# Patient Record
Sex: Female | Born: 1940 | Race: White | Hispanic: No | Smoking: Never smoker
Health system: Southern US, Community
[De-identification: ages and names within clinical notes are randomized; demographics above are authoritative.]

## PROBLEM LIST (undated history)

## (undated) DIAGNOSIS — C801 Malignant (primary) neoplasm, unspecified: Secondary | ICD-10-CM

## (undated) DIAGNOSIS — K589 Irritable bowel syndrome without diarrhea: Secondary | ICD-10-CM

## (undated) DIAGNOSIS — Z9889 Other specified postprocedural states: Secondary | ICD-10-CM

## (undated) DIAGNOSIS — M1990 Unspecified osteoarthritis, unspecified site: Secondary | ICD-10-CM

## (undated) DIAGNOSIS — R42 Dizziness and giddiness: Secondary | ICD-10-CM

## (undated) DIAGNOSIS — R112 Nausea with vomiting, unspecified: Secondary | ICD-10-CM

## (undated) DIAGNOSIS — Z923 Personal history of irradiation: Secondary | ICD-10-CM

## (undated) DIAGNOSIS — N6019 Diffuse cystic mastopathy of unspecified breast: Secondary | ICD-10-CM

## (undated) DIAGNOSIS — I10 Essential (primary) hypertension: Secondary | ICD-10-CM

## (undated) DIAGNOSIS — Z8719 Personal history of other diseases of the digestive system: Secondary | ICD-10-CM

## (undated) DIAGNOSIS — E785 Hyperlipidemia, unspecified: Secondary | ICD-10-CM

## (undated) DIAGNOSIS — I499 Cardiac arrhythmia, unspecified: Secondary | ICD-10-CM

## (undated) DIAGNOSIS — C50919 Malignant neoplasm of unspecified site of unspecified female breast: Secondary | ICD-10-CM

## (undated) DIAGNOSIS — K219 Gastro-esophageal reflux disease without esophagitis: Secondary | ICD-10-CM

## (undated) DIAGNOSIS — R001 Bradycardia, unspecified: Secondary | ICD-10-CM

## (undated) DIAGNOSIS — Z8489 Family history of other specified conditions: Secondary | ICD-10-CM

## (undated) DIAGNOSIS — R072 Precordial pain: Secondary | ICD-10-CM

## (undated) HISTORY — PX: EYE SURGERY: SHX253

## (undated) HISTORY — PX: BREAST LUMPECTOMY: SHX2

## (undated) HISTORY — PX: ABDOMINAL HYSTERECTOMY: SHX81

---

## 2001-03-12 DIAGNOSIS — C50911 Malignant neoplasm of unspecified site of right female breast: Secondary | ICD-10-CM

## 2001-03-12 HISTORY — DX: Malignant neoplasm of unspecified site of right female breast: C50.911

## 2001-03-12 HISTORY — PX: BREAST BIOPSY: SHX20

## 2001-12-31 ENCOUNTER — Encounter (HOSPITAL_COMMUNITY): Admission: RE | Admit: 2001-12-31 | Discharge: 2002-03-31 | Payer: Self-pay | Admitting: Internal Medicine

## 2001-12-31 ENCOUNTER — Encounter: Payer: Self-pay | Admitting: Internal Medicine

## 2002-01-01 ENCOUNTER — Encounter: Payer: Self-pay | Admitting: Internal Medicine

## 2002-01-09 ENCOUNTER — Encounter: Payer: Self-pay | Admitting: Internal Medicine

## 2002-01-09 ENCOUNTER — Encounter: Admission: RE | Admit: 2002-01-09 | Discharge: 2002-01-09 | Payer: Self-pay | Admitting: Internal Medicine

## 2002-01-09 ENCOUNTER — Encounter (INDEPENDENT_AMBULATORY_CARE_PROVIDER_SITE_OTHER): Payer: Self-pay | Admitting: Specialist

## 2002-01-19 ENCOUNTER — Encounter: Payer: Self-pay | Admitting: General Surgery

## 2002-01-19 ENCOUNTER — Encounter: Admission: RE | Admit: 2002-01-19 | Discharge: 2002-01-19 | Payer: Self-pay | Admitting: General Surgery

## 2002-01-20 ENCOUNTER — Encounter: Payer: Self-pay | Admitting: General Surgery

## 2002-01-20 ENCOUNTER — Ambulatory Visit (HOSPITAL_BASED_OUTPATIENT_CLINIC_OR_DEPARTMENT_OTHER): Admission: RE | Admit: 2002-01-20 | Discharge: 2002-01-20 | Payer: Self-pay | Admitting: General Surgery

## 2002-01-20 ENCOUNTER — Encounter (INDEPENDENT_AMBULATORY_CARE_PROVIDER_SITE_OTHER): Payer: Self-pay | Admitting: Specialist

## 2002-01-20 ENCOUNTER — Encounter: Admission: RE | Admit: 2002-01-20 | Discharge: 2002-01-20 | Payer: Self-pay | Admitting: General Surgery

## 2002-01-29 ENCOUNTER — Ambulatory Visit: Admission: RE | Admit: 2002-01-29 | Discharge: 2002-02-16 | Payer: Self-pay | Admitting: Radiation Oncology

## 2002-08-24 ENCOUNTER — Encounter: Admission: RE | Admit: 2002-08-24 | Discharge: 2002-08-24 | Payer: Self-pay | Admitting: General Surgery

## 2002-08-24 ENCOUNTER — Encounter: Payer: Self-pay | Admitting: General Surgery

## 2003-01-28 ENCOUNTER — Encounter: Admission: RE | Admit: 2003-01-28 | Discharge: 2003-01-28 | Payer: Self-pay | Admitting: General Surgery

## 2003-09-17 ENCOUNTER — Encounter: Admission: RE | Admit: 2003-09-17 | Discharge: 2003-09-17 | Payer: Self-pay | Admitting: Oncology

## 2004-01-26 ENCOUNTER — Ambulatory Visit: Payer: Self-pay | Admitting: Oncology

## 2004-02-01 ENCOUNTER — Encounter: Admission: RE | Admit: 2004-02-01 | Discharge: 2004-02-01 | Payer: Self-pay | Admitting: Oncology

## 2004-07-24 ENCOUNTER — Ambulatory Visit: Payer: Self-pay | Admitting: Oncology

## 2004-08-01 ENCOUNTER — Encounter: Admission: RE | Admit: 2004-08-01 | Discharge: 2004-08-01 | Payer: Self-pay | Admitting: General Surgery

## 2004-12-07 ENCOUNTER — Ambulatory Visit: Payer: Self-pay | Admitting: Radiation Oncology

## 2005-01-26 ENCOUNTER — Ambulatory Visit: Payer: Self-pay | Admitting: Oncology

## 2005-02-07 ENCOUNTER — Encounter: Admission: RE | Admit: 2005-02-07 | Discharge: 2005-02-07 | Payer: Self-pay | Admitting: General Surgery

## 2005-02-22 ENCOUNTER — Encounter: Admission: RE | Admit: 2005-02-22 | Discharge: 2005-02-22 | Payer: Self-pay | Admitting: General Surgery

## 2005-07-30 ENCOUNTER — Ambulatory Visit: Payer: Self-pay | Admitting: Oncology

## 2005-08-01 ENCOUNTER — Encounter: Admission: RE | Admit: 2005-08-01 | Discharge: 2005-08-01 | Payer: Self-pay | Admitting: Oncology

## 2005-08-01 LAB — CBC WITH DIFFERENTIAL/PLATELET
BASO%: 0.3 % (ref 0.0–2.0)
Basophils Absolute: 0 10*3/uL (ref 0.0–0.1)
EOS%: 6.5 % (ref 0.0–7.0)
Eosinophils Absolute: 0.2 10*3/uL (ref 0.0–0.5)
HCT: 38.5 % (ref 34.8–46.6)
HGB: 13.4 g/dL (ref 11.6–15.9)
LYMPH%: 25.4 % (ref 14.0–48.0)
MCH: 31.6 pg (ref 26.0–34.0)
MCHC: 34.8 g/dL (ref 32.0–36.0)
MCV: 91 fL (ref 81.0–101.0)
MONO#: 0.5 10*3/uL (ref 0.1–0.9)
MONO%: 13.9 % — ABNORMAL HIGH (ref 0.0–13.0)
NEUT#: 1.9 10*3/uL (ref 1.5–6.5)
NEUT%: 53.9 % (ref 39.6–76.8)
Platelets: 277 10*3/uL (ref 145–400)
RBC: 4.23 10*6/uL (ref 3.70–5.32)
RDW: 13 % (ref 11.3–14.5)
WBC: 3.6 10*3/uL — ABNORMAL LOW (ref 3.9–10.0)
lymph#: 0.9 10*3/uL (ref 0.9–3.3)

## 2005-08-01 LAB — COMPREHENSIVE METABOLIC PANEL
ALT: 30 U/L (ref 0–40)
AST: 27 U/L (ref 0–37)
Albumin: 4.5 g/dL (ref 3.5–5.2)
Alkaline Phosphatase: 63 U/L (ref 39–117)
BUN: 11 mg/dL (ref 6–23)
CO2: 27 mEq/L (ref 19–32)
Calcium: 9.2 mg/dL (ref 8.4–10.5)
Chloride: 103 mEq/L (ref 96–112)
Creatinine, Ser: 1 mg/dL (ref 0.4–1.2)
Glucose, Bld: 110 mg/dL — ABNORMAL HIGH (ref 70–99)
Potassium: 3.5 mEq/L (ref 3.5–5.3)
Sodium: 139 mEq/L (ref 135–145)
Total Bilirubin: 0.6 mg/dL (ref 0.3–1.2)
Total Protein: 6.8 g/dL (ref 6.0–8.3)

## 2005-12-10 ENCOUNTER — Ambulatory Visit: Payer: Self-pay | Admitting: Radiation Oncology

## 2006-02-08 ENCOUNTER — Encounter: Admission: RE | Admit: 2006-02-08 | Discharge: 2006-02-08 | Payer: Self-pay | Admitting: General Surgery

## 2006-07-29 ENCOUNTER — Ambulatory Visit: Payer: Self-pay | Admitting: Oncology

## 2006-07-31 LAB — CBC WITH DIFFERENTIAL/PLATELET
Basophils Absolute: 0 10*3/uL (ref 0.0–0.1)
EOS%: 4.8 % (ref 0.0–7.0)
HCT: 40 % (ref 34.8–46.6)
HGB: 14.4 g/dL (ref 11.6–15.9)
LYMPH%: 32.1 % (ref 14.0–48.0)
MCH: 32 pg (ref 26.0–34.0)
MCV: 89.3 fL (ref 81.0–101.0)
MONO%: 11.7 % (ref 0.0–13.0)
NEUT%: 51 % (ref 39.6–76.8)
Platelets: 269 10*3/uL (ref 145–400)
lymph#: 1.6 10*3/uL (ref 0.9–3.3)

## 2006-07-31 LAB — COMPREHENSIVE METABOLIC PANEL
AST: 22 U/L (ref 0–37)
BUN: 17 mg/dL (ref 6–23)
Calcium: 10 mg/dL (ref 8.4–10.5)
Chloride: 101 mEq/L (ref 96–112)
Creatinine, Ser: 1.06 mg/dL (ref 0.40–1.20)
Total Bilirubin: 0.8 mg/dL (ref 0.3–1.2)

## 2006-10-22 ENCOUNTER — Ambulatory Visit: Payer: Self-pay | Admitting: Gastroenterology

## 2007-01-24 ENCOUNTER — Ambulatory Visit: Payer: Self-pay | Admitting: Oncology

## 2007-01-29 LAB — COMPREHENSIVE METABOLIC PANEL
AST: 23 U/L (ref 0–37)
Albumin: 4.9 g/dL (ref 3.5–5.2)
BUN: 20 mg/dL (ref 6–23)
Calcium: 9.8 mg/dL (ref 8.4–10.5)
Chloride: 103 mEq/L (ref 96–112)
Creatinine, Ser: 1.05 mg/dL (ref 0.40–1.20)
Glucose, Bld: 93 mg/dL (ref 70–99)

## 2007-01-29 LAB — CBC WITH DIFFERENTIAL/PLATELET
Basophils Absolute: 0 10*3/uL (ref 0.0–0.1)
EOS%: 5.1 % (ref 0.0–7.0)
Eosinophils Absolute: 0.3 10*3/uL (ref 0.0–0.5)
HCT: 42.3 % (ref 34.8–46.6)
HGB: 15 g/dL (ref 11.6–15.9)
MCH: 31.9 pg (ref 26.0–34.0)
MCV: 89.8 fL (ref 81.0–101.0)
MONO%: 9.4 % (ref 0.0–13.0)
NEUT#: 3.1 10*3/uL (ref 1.5–6.5)
NEUT%: 56.8 % (ref 39.6–76.8)
lymph#: 1.5 10*3/uL (ref 0.9–3.3)

## 2007-01-29 LAB — LACTATE DEHYDROGENASE: LDH: 167 U/L (ref 94–250)

## 2007-02-10 ENCOUNTER — Encounter: Admission: RE | Admit: 2007-02-10 | Discharge: 2007-02-10 | Payer: Self-pay | Admitting: Oncology

## 2007-02-14 ENCOUNTER — Encounter: Admission: RE | Admit: 2007-02-14 | Discharge: 2007-02-14 | Payer: Self-pay | Admitting: Oncology

## 2007-06-07 ENCOUNTER — Ambulatory Visit: Payer: Self-pay

## 2007-07-31 ENCOUNTER — Ambulatory Visit: Payer: Self-pay | Admitting: Oncology

## 2007-08-05 ENCOUNTER — Encounter: Admission: RE | Admit: 2007-08-05 | Discharge: 2007-08-05 | Payer: Self-pay | Admitting: Oncology

## 2008-02-13 ENCOUNTER — Encounter: Admission: RE | Admit: 2008-02-13 | Discharge: 2008-02-13 | Payer: Self-pay | Admitting: Oncology

## 2008-07-28 ENCOUNTER — Ambulatory Visit: Payer: Self-pay | Admitting: Oncology

## 2008-07-30 LAB — CBC WITH DIFFERENTIAL/PLATELET
BASO%: 0.5 % (ref 0.0–2.0)
Basophils Absolute: 0 10*3/uL (ref 0.0–0.1)
EOS%: 6.6 % (ref 0.0–7.0)
Eosinophils Absolute: 0.3 10*3/uL (ref 0.0–0.5)
HCT: 37.6 % (ref 34.8–46.6)
HGB: 13.2 g/dL (ref 11.6–15.9)
LYMPH%: 26.5 % (ref 14.0–49.7)
MCH: 32.1 pg (ref 25.1–34.0)
MCHC: 35.1 g/dL (ref 31.5–36.0)
MCV: 91.5 fL (ref 79.5–101.0)
MONO#: 0.4 10*3/uL (ref 0.1–0.9)
MONO%: 7.9 % (ref 0.0–14.0)
NEUT#: 2.6 10*3/uL (ref 1.5–6.5)
NEUT%: 58.5 % (ref 38.4–76.8)
Platelets: 217 10*3/uL (ref 145–400)
RBC: 4.11 10*6/uL (ref 3.70–5.45)
RDW: 13.4 % (ref 11.2–14.5)
WBC: 4.5 10*3/uL (ref 3.9–10.3)
lymph#: 1.2 10*3/uL (ref 0.9–3.3)

## 2008-08-02 LAB — COMPREHENSIVE METABOLIC PANEL
ALT: 20 U/L (ref 0–35)
AST: 21 U/L (ref 0–37)
Albumin: 4.4 g/dL (ref 3.5–5.2)
Alkaline Phosphatase: 56 U/L (ref 39–117)
BUN: 17 mg/dL (ref 6–23)
CO2: 28 mEq/L (ref 19–32)
Calcium: 9.9 mg/dL (ref 8.4–10.5)
Chloride: 103 mEq/L (ref 96–112)
Creatinine, Ser: 0.86 mg/dL (ref 0.40–1.20)
Glucose, Bld: 114 mg/dL — ABNORMAL HIGH (ref 70–99)
Potassium: 3.6 mEq/L (ref 3.5–5.3)
Sodium: 142 mEq/L (ref 135–145)
Total Bilirubin: 0.7 mg/dL (ref 0.3–1.2)
Total Protein: 6.8 g/dL (ref 6.0–8.3)

## 2008-08-02 LAB — CANCER ANTIGEN 27.29: CA 27.29: 11 U/mL (ref 0–39)

## 2008-08-02 LAB — VITAMIN D 25 HYDROXY (VIT D DEFICIENCY, FRACTURES): Vit D, 25-Hydroxy: 31 ng/mL (ref 30–89)

## 2008-08-02 LAB — LACTATE DEHYDROGENASE: LDH: 157 U/L (ref 94–250)

## 2008-08-26 ENCOUNTER — Observation Stay: Payer: Self-pay | Admitting: Internal Medicine

## 2008-09-28 ENCOUNTER — Ambulatory Visit: Payer: Self-pay | Admitting: Oncology

## 2009-02-14 ENCOUNTER — Encounter: Admission: RE | Admit: 2009-02-14 | Discharge: 2009-02-14 | Payer: Self-pay | Admitting: Oncology

## 2009-03-09 ENCOUNTER — Ambulatory Visit: Payer: Self-pay | Admitting: Internal Medicine

## 2009-03-23 ENCOUNTER — Ambulatory Visit: Payer: Self-pay | Admitting: Oncology

## 2009-03-25 LAB — COMPREHENSIVE METABOLIC PANEL
ALT: 15 U/L (ref 0–35)
AST: 17 U/L (ref 0–37)
Albumin: 4.4 g/dL (ref 3.5–5.2)
Alkaline Phosphatase: 48 U/L (ref 39–117)
BUN: 18 mg/dL (ref 6–23)
CO2: 26 mEq/L (ref 19–32)
Calcium: 9.3 mg/dL (ref 8.4–10.5)
Chloride: 102 mEq/L (ref 96–112)
Creatinine, Ser: 1.18 mg/dL (ref 0.40–1.20)
Glucose, Bld: 106 mg/dL — ABNORMAL HIGH (ref 70–99)
Potassium: 3.9 mEq/L (ref 3.5–5.3)
Sodium: 143 mEq/L (ref 135–145)
Total Bilirubin: 0.7 mg/dL (ref 0.3–1.2)
Total Protein: 6.6 g/dL (ref 6.0–8.3)

## 2009-03-25 LAB — CBC WITH DIFFERENTIAL/PLATELET
BASO%: 0.5 % (ref 0.0–2.0)
Basophils Absolute: 0 10*3/uL (ref 0.0–0.1)
EOS%: 4.3 % (ref 0.0–7.0)
Eosinophils Absolute: 0.3 10*3/uL (ref 0.0–0.5)
HCT: 41.1 % (ref 34.8–46.6)
HGB: 14.1 g/dL (ref 11.6–15.9)
LYMPH%: 25 % (ref 14.0–49.7)
MCH: 31.5 pg (ref 25.1–34.0)
MCHC: 34.3 g/dL (ref 31.5–36.0)
MCV: 91.9 fL (ref 79.5–101.0)
MONO#: 0.5 10*3/uL (ref 0.1–0.9)
MONO%: 9.3 % (ref 0.0–14.0)
NEUT#: 3.5 10*3/uL (ref 1.5–6.5)
NEUT%: 60.9 % (ref 38.4–76.8)
Platelets: 194 10*3/uL (ref 145–400)
RBC: 4.47 10*6/uL (ref 3.70–5.45)
RDW: 13.1 % (ref 11.2–14.5)
WBC: 5.8 10*3/uL (ref 3.9–10.3)
lymph#: 1.5 10*3/uL (ref 0.9–3.3)

## 2009-03-25 LAB — CANCER ANTIGEN 27.29: CA 27.29: 12 U/mL (ref 0–39)

## 2009-03-25 LAB — VITAMIN D 25 HYDROXY (VIT D DEFICIENCY, FRACTURES): Vit D, 25-Hydroxy: 39 ng/mL (ref 30–89)

## 2009-03-25 LAB — LACTATE DEHYDROGENASE: LDH: 175 U/L (ref 94–250)

## 2009-12-23 ENCOUNTER — Ambulatory Visit: Payer: Self-pay | Admitting: Gastroenterology

## 2010-01-17 ENCOUNTER — Ambulatory Visit: Payer: Self-pay | Admitting: Internal Medicine

## 2010-02-15 ENCOUNTER — Encounter
Admission: RE | Admit: 2010-02-15 | Discharge: 2010-02-15 | Payer: Self-pay | Source: Home / Self Care | Admitting: Oncology

## 2010-04-01 ENCOUNTER — Encounter: Payer: Self-pay | Admitting: Oncology

## 2010-07-28 NOTE — Op Note (Signed)
NAME:  Cheryl Morris, Cheryl Morris                         ACCOUNT NO.:  [id number]   MEDICAL RECORD NO.:  [id number]                   PATIENT TYPE:  AMB   LOCATION:  DSC                                  FACILITY:  [facility]   PHYSICIAN:  Rose Phi. Maple Hudson, M.D.                DATE OF BIRTH:  13-Mar-1940   DATE OF PROCEDURE:  01/20/2002  DATE OF DISCHARGE:                                 OPERATIVE REPORT   PREOPERATIVE DIAGNOSIS:  Stage I carcinoma of the right breast.   POSTOPERATIVE DIAGNOSIS:  Stage I carcinoma of the right breast.   OPERATION PERFORMED:  1. Blue dye injection.  2. Right partial mastectomy with needle localization and specimen     mammography.  3. Right axillary sentinel lymph node biopsy.   SURGEON:  Rose Phi. Maple Hudson, M.D.   ANESTHESIA:  General.   INDICATIONS FOR PROCEDURE:  This patient had presented with an abnormal  mammogram which, on core biopsy, was shown to have a small invasive cancer  at about the 11:30 to 12 o'clock position of the right breast.  Prior to  coming to the operating room, an ultrasound guided localization wire was  placed in position and 1 mCi of technetium sulfur colloid was injected  intradermally.   DESCRIPTION OF PROCEDURE:  After suitable general anesthesia was induced,  the patient was placed in supine position and then we injected 5 cc of  Lymphazurin blue in the subareolar tissue.  We then prepped and draped the  breast and axilla.  A curved incision in the 12 o'clock position of the  right breast was then made using a previously placed wire and a marking at  the 12 o'clock position and a wide excision of the wire and surrounding  tissue was carried out.  Specimen was oriented for the pathologist.  Specimen mammography confirmed the removal of the lesion and Touch Preps of  the margins showed no tumor involvement.   While that was being done we scanned the right axilla and then made a short  transverse right axillary incision with  dissection through the subcutaneous  tissues to the clavipectoral fascia.  Just beneath the clavipectoral fascia  was a blue and hot lymph node with counts in excess of 500.  We mobilized  that, clipped the lymphatics and removed the one hot and blue sentinel node.  There was no other palpable blue or hot nodes.  Touch Preps of the lymph  node showed no metastatic disease.   Incisions were closed with 3-0 Vicryl and subcuticular 4-0 Monocryl and  Steri-Strips.  Dressings applied.  The patient was then transferred to the  recovery room in satisfactory condition having tolerated the procedure well.  Rose Phi. Maple Hudson, M.D.    PRY/MEDQ  D:  01/20/2002  T:  01/20/2002  Job:  [id number]   cc:   Yates Decamp

## 2010-12-12 ENCOUNTER — Other Ambulatory Visit: Payer: Self-pay | Admitting: Internal Medicine

## 2010-12-12 DIAGNOSIS — Z1231 Encounter for screening mammogram for malignant neoplasm of breast: Secondary | ICD-10-CM

## 2011-02-19 ENCOUNTER — Ambulatory Visit
Admission: RE | Admit: 2011-02-19 | Discharge: 2011-02-19 | Disposition: A | Discharge status: Home / Self Care | Payer: Medicare Other | Source: Ambulatory Visit | Attending: Unknown Physician Specialty | Admitting: Unknown Physician Specialty

## 2011-02-19 DIAGNOSIS — Z1231 Encounter for screening mammogram for malignant neoplasm of breast: Secondary | ICD-10-CM

## 2011-10-15 ENCOUNTER — Other Ambulatory Visit: Payer: Self-pay | Admitting: *Deleted

## 2011-10-15 ENCOUNTER — Other Ambulatory Visit: Payer: Self-pay | Admitting: Oncology

## 2011-10-15 DIAGNOSIS — Z1231 Encounter for screening mammogram for malignant neoplasm of breast: Secondary | ICD-10-CM

## 2012-02-20 ENCOUNTER — Ambulatory Visit
Admission: RE | Admit: 2012-02-20 | Discharge: 2012-02-20 | Disposition: A | Discharge status: Home / Self Care | Payer: Medicare Other | Source: Ambulatory Visit | Attending: *Deleted | Admitting: *Deleted

## 2012-02-20 DIAGNOSIS — Z1231 Encounter for screening mammogram for malignant neoplasm of breast: Secondary | ICD-10-CM

## 2013-02-26 ENCOUNTER — Ambulatory Visit: Payer: Self-pay | Admitting: Obstetrics and Gynecology

## 2013-03-09 ENCOUNTER — Ambulatory Visit: Payer: Self-pay | Admitting: Gastroenterology

## 2013-03-11 LAB — PATHOLOGY REPORT

## 2013-03-19 ENCOUNTER — Ambulatory Visit: Payer: Self-pay | Admitting: Obstetrics and Gynecology

## 2014-03-01 ENCOUNTER — Ambulatory Visit: Payer: Self-pay | Admitting: Internal Medicine

## 2015-01-27 ENCOUNTER — Other Ambulatory Visit: Payer: Self-pay | Admitting: Obstetrics and Gynecology

## 2015-01-27 DIAGNOSIS — Z1231 Encounter for screening mammogram for malignant neoplasm of breast: Secondary | ICD-10-CM

## 2015-03-03 ENCOUNTER — Ambulatory Visit
Admission: RE | Admit: 2015-03-03 | Discharge: 2015-03-03 | Disposition: A | Discharge status: Home / Self Care | Payer: PPO | Source: Ambulatory Visit | Attending: Obstetrics and Gynecology | Admitting: Obstetrics and Gynecology

## 2015-03-03 DIAGNOSIS — Z1231 Encounter for screening mammogram for malignant neoplasm of breast: Secondary | ICD-10-CM | POA: Diagnosis not present

## 2015-03-03 DIAGNOSIS — Z9889 Other specified postprocedural states: Secondary | ICD-10-CM | POA: Insufficient documentation

## 2015-03-03 DIAGNOSIS — Z853 Personal history of malignant neoplasm of breast: Secondary | ICD-10-CM | POA: Insufficient documentation

## 2015-03-03 HISTORY — DX: Malignant (primary) neoplasm, unspecified: C80.1

## 2015-03-03 HISTORY — DX: Malignant neoplasm of unspecified site of unspecified female breast: C50.919

## 2015-03-29 DIAGNOSIS — M5431 Sciatica, right side: Secondary | ICD-10-CM | POA: Diagnosis not present

## 2015-03-29 DIAGNOSIS — M47816 Spondylosis without myelopathy or radiculopathy, lumbar region: Secondary | ICD-10-CM | POA: Diagnosis not present

## 2015-04-12 ENCOUNTER — Other Ambulatory Visit: Payer: Self-pay | Admitting: Internal Medicine

## 2015-04-12 DIAGNOSIS — M5431 Sciatica, right side: Secondary | ICD-10-CM

## 2015-04-22 ENCOUNTER — Ambulatory Visit
Admission: RE | Admit: 2015-04-22 | Discharge: 2015-04-22 | Disposition: A | Discharge status: Home / Self Care | Payer: PPO | Source: Ambulatory Visit | Attending: Internal Medicine | Admitting: Internal Medicine

## 2015-04-22 ENCOUNTER — Ambulatory Visit: Payer: PPO

## 2015-04-22 DIAGNOSIS — M5431 Sciatica, right side: Secondary | ICD-10-CM

## 2015-04-22 DIAGNOSIS — M5441 Lumbago with sciatica, right side: Secondary | ICD-10-CM | POA: Diagnosis not present

## 2015-04-22 DIAGNOSIS — M4806 Spinal stenosis, lumbar region: Secondary | ICD-10-CM | POA: Diagnosis not present

## 2015-04-22 DIAGNOSIS — M47816 Spondylosis without myelopathy or radiculopathy, lumbar region: Secondary | ICD-10-CM | POA: Insufficient documentation

## 2015-04-28 ENCOUNTER — Other Ambulatory Visit: Payer: Self-pay | Admitting: Neurosurgery

## 2015-04-28 DIAGNOSIS — M5416 Radiculopathy, lumbar region: Secondary | ICD-10-CM | POA: Diagnosis not present

## 2015-04-28 DIAGNOSIS — Z6827 Body mass index (BMI) 27.0-27.9, adult: Secondary | ICD-10-CM | POA: Diagnosis not present

## 2015-04-28 DIAGNOSIS — M549 Dorsalgia, unspecified: Secondary | ICD-10-CM | POA: Diagnosis not present

## 2015-05-03 ENCOUNTER — Other Ambulatory Visit: Payer: PPO

## 2015-05-10 ENCOUNTER — Ambulatory Visit
Admission: RE | Admit: 2015-05-10 | Discharge: 2015-05-10 | Disposition: A | Discharge status: Home / Self Care | Payer: PPO | Source: Ambulatory Visit | Attending: Neurosurgery | Admitting: Neurosurgery

## 2015-05-10 DIAGNOSIS — M5416 Radiculopathy, lumbar region: Secondary | ICD-10-CM

## 2015-05-10 DIAGNOSIS — M5126 Other intervertebral disc displacement, lumbar region: Secondary | ICD-10-CM | POA: Diagnosis not present

## 2015-05-10 MED ORDER — IOHEXOL 180 MG/ML  SOLN
1.0000 mL | Freq: Once | INTRAMUSCULAR | Status: AC | PRN
  Administered 2015-05-10: 1 mL via EPIDURAL

## 2015-05-10 MED ORDER — METHYLPREDNISOLONE ACETATE 40 MG/ML INJ SUSP (RADIOLOG
120.0000 mg | Freq: Once | INTRAMUSCULAR | Status: AC
  Administered 2015-05-10: 120 mg via EPIDURAL

## 2015-05-10 NOTE — Discharge Instructions (Signed)

## 2015-05-24 ENCOUNTER — Other Ambulatory Visit: Payer: Self-pay | Admitting: Neurosurgery

## 2015-05-24 DIAGNOSIS — M5416 Radiculopathy, lumbar region: Secondary | ICD-10-CM

## 2015-05-25 DIAGNOSIS — I10 Essential (primary) hypertension: Secondary | ICD-10-CM | POA: Diagnosis not present

## 2015-05-25 DIAGNOSIS — E782 Mixed hyperlipidemia: Secondary | ICD-10-CM | POA: Diagnosis not present

## 2015-05-30 DIAGNOSIS — J209 Acute bronchitis, unspecified: Secondary | ICD-10-CM | POA: Diagnosis not present

## 2015-06-02 ENCOUNTER — Other Ambulatory Visit: Payer: PPO

## 2015-06-03 DIAGNOSIS — B370 Candidal stomatitis: Secondary | ICD-10-CM | POA: Diagnosis not present

## 2015-06-13 DIAGNOSIS — I10 Essential (primary) hypertension: Secondary | ICD-10-CM | POA: Diagnosis not present

## 2015-06-16 ENCOUNTER — Other Ambulatory Visit: Payer: Self-pay | Admitting: Neurosurgery

## 2015-06-16 ENCOUNTER — Ambulatory Visit
Admission: RE | Admit: 2015-06-16 | Discharge: 2015-06-16 | Disposition: A | Discharge status: Home / Self Care | Payer: PPO | Source: Ambulatory Visit | Attending: Neurosurgery | Admitting: Neurosurgery

## 2015-06-16 DIAGNOSIS — M5126 Other intervertebral disc displacement, lumbar region: Secondary | ICD-10-CM | POA: Diagnosis not present

## 2015-06-16 DIAGNOSIS — M5416 Radiculopathy, lumbar region: Secondary | ICD-10-CM

## 2015-06-16 MED ORDER — METHYLPREDNISOLONE ACETATE 40 MG/ML INJ SUSP (RADIOLOG
120.0000 mg | Freq: Once | INTRAMUSCULAR | Status: AC
  Administered 2015-06-16: 120 mg via EPIDURAL

## 2015-06-16 MED ORDER — IOHEXOL 180 MG/ML  SOLN
1.0000 mL | Freq: Once | INTRAMUSCULAR | Status: AC | PRN
  Administered 2015-06-16: 1 mL via EPIDURAL

## 2015-06-20 DIAGNOSIS — I10 Essential (primary) hypertension: Secondary | ICD-10-CM | POA: Diagnosis not present

## 2015-07-21 ENCOUNTER — Other Ambulatory Visit: Payer: Self-pay | Admitting: Neurosurgery

## 2015-07-21 DIAGNOSIS — M5416 Radiculopathy, lumbar region: Secondary | ICD-10-CM

## 2015-08-04 ENCOUNTER — Ambulatory Visit
Admission: RE | Admit: 2015-08-04 | Discharge: 2015-08-04 | Disposition: A | Discharge status: Home / Self Care | Payer: PPO | Source: Ambulatory Visit | Attending: Neurosurgery | Admitting: Neurosurgery

## 2015-08-04 DIAGNOSIS — M5416 Radiculopathy, lumbar region: Secondary | ICD-10-CM

## 2015-08-04 DIAGNOSIS — M4806 Spinal stenosis, lumbar region: Secondary | ICD-10-CM | POA: Diagnosis not present

## 2015-08-04 MED ORDER — METHYLPREDNISOLONE ACETATE 40 MG/ML INJ SUSP (RADIOLOG
120.0000 mg | Freq: Once | INTRAMUSCULAR | Status: AC
  Administered 2015-08-04: 120 mg via EPIDURAL

## 2015-08-04 MED ORDER — IOPAMIDOL (ISOVUE-M 200) INJECTION 41%
1.0000 mL | Freq: Once | INTRAMUSCULAR | Status: AC
  Administered 2015-08-04: 1 mL via EPIDURAL

## 2015-08-04 NOTE — Discharge Instructions (Signed)

## 2015-08-18 DIAGNOSIS — I10 Essential (primary) hypertension: Secondary | ICD-10-CM | POA: Diagnosis not present

## 2015-08-18 DIAGNOSIS — Z6827 Body mass index (BMI) 27.0-27.9, adult: Secondary | ICD-10-CM | POA: Diagnosis not present

## 2015-08-18 DIAGNOSIS — M5416 Radiculopathy, lumbar region: Secondary | ICD-10-CM | POA: Diagnosis not present

## 2015-08-25 DIAGNOSIS — H2513 Age-related nuclear cataract, bilateral: Secondary | ICD-10-CM | POA: Diagnosis not present

## 2015-08-25 DIAGNOSIS — H353132 Nonexudative age-related macular degeneration, bilateral, intermediate dry stage: Secondary | ICD-10-CM | POA: Diagnosis not present

## 2015-09-12 DIAGNOSIS — I10 Essential (primary) hypertension: Secondary | ICD-10-CM | POA: Diagnosis not present

## 2015-09-20 DIAGNOSIS — E782 Mixed hyperlipidemia: Secondary | ICD-10-CM | POA: Diagnosis not present

## 2015-09-20 DIAGNOSIS — I10 Essential (primary) hypertension: Secondary | ICD-10-CM | POA: Diagnosis not present

## 2015-09-20 DIAGNOSIS — E876 Hypokalemia: Secondary | ICD-10-CM | POA: Diagnosis not present

## 2015-09-20 DIAGNOSIS — T502X5A Adverse effect of carbonic-anhydrase inhibitors, benzothiadiazides and other diuretics, initial encounter: Secondary | ICD-10-CM | POA: Diagnosis not present

## 2015-12-05 DIAGNOSIS — L821 Other seborrheic keratosis: Secondary | ICD-10-CM | POA: Diagnosis not present

## 2015-12-05 DIAGNOSIS — Z1283 Encounter for screening for malignant neoplasm of skin: Secondary | ICD-10-CM | POA: Diagnosis not present

## 2015-12-05 DIAGNOSIS — D1800 Hemangioma unspecified site: Secondary | ICD-10-CM | POA: Diagnosis not present

## 2015-12-05 DIAGNOSIS — L578 Other skin changes due to chronic exposure to nonionizing radiation: Secondary | ICD-10-CM | POA: Diagnosis not present

## 2015-12-05 DIAGNOSIS — L820 Inflamed seborrheic keratosis: Secondary | ICD-10-CM | POA: Diagnosis not present

## 2015-12-05 DIAGNOSIS — L219 Seborrheic dermatitis, unspecified: Secondary | ICD-10-CM | POA: Diagnosis not present

## 2015-12-05 DIAGNOSIS — D229 Melanocytic nevi, unspecified: Secondary | ICD-10-CM | POA: Diagnosis not present

## 2015-12-05 DIAGNOSIS — L814 Other melanin hyperpigmentation: Secondary | ICD-10-CM | POA: Diagnosis not present

## 2015-12-05 DIAGNOSIS — L720 Epidermal cyst: Secondary | ICD-10-CM | POA: Diagnosis not present

## 2015-12-14 DIAGNOSIS — I10 Essential (primary) hypertension: Secondary | ICD-10-CM | POA: Diagnosis not present

## 2015-12-14 DIAGNOSIS — E782 Mixed hyperlipidemia: Secondary | ICD-10-CM | POA: Diagnosis not present

## 2015-12-21 DIAGNOSIS — Z0001 Encounter for general adult medical examination with abnormal findings: Secondary | ICD-10-CM | POA: Diagnosis not present

## 2015-12-21 DIAGNOSIS — Z23 Encounter for immunization: Secondary | ICD-10-CM | POA: Diagnosis not present

## 2015-12-21 DIAGNOSIS — I10 Essential (primary) hypertension: Secondary | ICD-10-CM | POA: Diagnosis not present

## 2015-12-21 DIAGNOSIS — Z1231 Encounter for screening mammogram for malignant neoplasm of breast: Secondary | ICD-10-CM | POA: Diagnosis not present

## 2015-12-21 DIAGNOSIS — Z0000 Encounter for general adult medical examination without abnormal findings: Secondary | ICD-10-CM | POA: Diagnosis not present

## 2015-12-21 DIAGNOSIS — E782 Mixed hyperlipidemia: Secondary | ICD-10-CM | POA: Diagnosis not present

## 2016-01-30 ENCOUNTER — Other Ambulatory Visit: Payer: Self-pay | Admitting: Obstetrics and Gynecology

## 2016-01-30 DIAGNOSIS — Z1231 Encounter for screening mammogram for malignant neoplasm of breast: Secondary | ICD-10-CM

## 2016-01-30 DIAGNOSIS — Z1211 Encounter for screening for malignant neoplasm of colon: Secondary | ICD-10-CM | POA: Diagnosis not present

## 2016-01-30 DIAGNOSIS — Z01419 Encounter for gynecological examination (general) (routine) without abnormal findings: Secondary | ICD-10-CM | POA: Diagnosis not present

## 2016-02-27 DIAGNOSIS — I10 Essential (primary) hypertension: Secondary | ICD-10-CM | POA: Diagnosis not present

## 2016-02-27 DIAGNOSIS — E782 Mixed hyperlipidemia: Secondary | ICD-10-CM | POA: Diagnosis not present

## 2016-03-13 ENCOUNTER — Ambulatory Visit
Admission: RE | Admit: 2016-03-13 | Discharge: 2016-03-13 | Disposition: A | Discharge status: Home / Self Care | Payer: PPO | Source: Ambulatory Visit | Attending: Obstetrics and Gynecology | Admitting: Obstetrics and Gynecology

## 2016-03-13 DIAGNOSIS — Z1231 Encounter for screening mammogram for malignant neoplasm of breast: Secondary | ICD-10-CM | POA: Diagnosis not present

## 2016-05-14 DIAGNOSIS — I10 Essential (primary) hypertension: Secondary | ICD-10-CM | POA: Diagnosis not present

## 2016-05-18 DIAGNOSIS — I10 Essential (primary) hypertension: Secondary | ICD-10-CM | POA: Diagnosis not present

## 2016-05-25 DIAGNOSIS — I10 Essential (primary) hypertension: Secondary | ICD-10-CM | POA: Diagnosis not present

## 2016-06-13 DIAGNOSIS — I10 Essential (primary) hypertension: Secondary | ICD-10-CM | POA: Diagnosis not present

## 2016-06-26 DIAGNOSIS — I10 Essential (primary) hypertension: Secondary | ICD-10-CM | POA: Diagnosis not present

## 2016-06-26 DIAGNOSIS — E782 Mixed hyperlipidemia: Secondary | ICD-10-CM | POA: Diagnosis not present

## 2016-08-27 DIAGNOSIS — E782 Mixed hyperlipidemia: Secondary | ICD-10-CM | POA: Diagnosis not present

## 2016-08-27 DIAGNOSIS — I10 Essential (primary) hypertension: Secondary | ICD-10-CM | POA: Diagnosis not present

## 2016-10-02 DIAGNOSIS — H353132 Nonexudative age-related macular degeneration, bilateral, intermediate dry stage: Secondary | ICD-10-CM | POA: Diagnosis not present

## 2016-12-04 DIAGNOSIS — L820 Inflamed seborrheic keratosis: Secondary | ICD-10-CM | POA: Diagnosis not present

## 2016-12-04 DIAGNOSIS — L281 Prurigo nodularis: Secondary | ICD-10-CM | POA: Diagnosis not present

## 2016-12-04 DIAGNOSIS — D692 Other nonthrombocytopenic purpura: Secondary | ICD-10-CM | POA: Diagnosis not present

## 2016-12-04 DIAGNOSIS — L400 Psoriasis vulgaris: Secondary | ICD-10-CM | POA: Diagnosis not present

## 2016-12-04 DIAGNOSIS — D239 Other benign neoplasm of skin, unspecified: Secondary | ICD-10-CM | POA: Diagnosis not present

## 2016-12-04 DIAGNOSIS — L918 Other hypertrophic disorders of the skin: Secondary | ICD-10-CM | POA: Diagnosis not present

## 2016-12-04 DIAGNOSIS — L578 Other skin changes due to chronic exposure to nonionizing radiation: Secondary | ICD-10-CM | POA: Diagnosis not present

## 2016-12-04 DIAGNOSIS — D229 Melanocytic nevi, unspecified: Secondary | ICD-10-CM | POA: Diagnosis not present

## 2016-12-04 DIAGNOSIS — L821 Other seborrheic keratosis: Secondary | ICD-10-CM | POA: Diagnosis not present

## 2016-12-04 DIAGNOSIS — D1800 Hemangioma unspecified site: Secondary | ICD-10-CM | POA: Diagnosis not present

## 2016-12-04 DIAGNOSIS — Z1283 Encounter for screening for malignant neoplasm of skin: Secondary | ICD-10-CM | POA: Diagnosis not present

## 2016-12-18 DIAGNOSIS — E782 Mixed hyperlipidemia: Secondary | ICD-10-CM | POA: Diagnosis not present

## 2016-12-18 DIAGNOSIS — I10 Essential (primary) hypertension: Secondary | ICD-10-CM | POA: Diagnosis not present

## 2016-12-25 DIAGNOSIS — Z0000 Encounter for general adult medical examination without abnormal findings: Secondary | ICD-10-CM | POA: Diagnosis not present

## 2016-12-25 DIAGNOSIS — E782 Mixed hyperlipidemia: Secondary | ICD-10-CM | POA: Diagnosis not present

## 2016-12-25 DIAGNOSIS — Z23 Encounter for immunization: Secondary | ICD-10-CM | POA: Diagnosis not present

## 2016-12-25 DIAGNOSIS — I10 Essential (primary) hypertension: Secondary | ICD-10-CM | POA: Diagnosis not present

## 2017-02-04 ENCOUNTER — Other Ambulatory Visit: Payer: Self-pay | Admitting: Obstetrics and Gynecology

## 2017-02-04 DIAGNOSIS — Z01419 Encounter for gynecological examination (general) (routine) without abnormal findings: Secondary | ICD-10-CM | POA: Diagnosis not present

## 2017-02-04 DIAGNOSIS — Z1231 Encounter for screening mammogram for malignant neoplasm of breast: Secondary | ICD-10-CM

## 2017-02-04 DIAGNOSIS — Z1211 Encounter for screening for malignant neoplasm of colon: Secondary | ICD-10-CM | POA: Diagnosis not present

## 2017-02-25 DIAGNOSIS — R001 Bradycardia, unspecified: Secondary | ICD-10-CM | POA: Diagnosis not present

## 2017-02-25 DIAGNOSIS — E782 Mixed hyperlipidemia: Secondary | ICD-10-CM | POA: Diagnosis not present

## 2017-02-25 DIAGNOSIS — I10 Essential (primary) hypertension: Secondary | ICD-10-CM | POA: Diagnosis not present

## 2017-03-20 ENCOUNTER — Ambulatory Visit
Admission: RE | Admit: 2017-03-20 | Discharge: 2017-03-20 | Disposition: A | Discharge status: Home / Self Care | Payer: PPO | Source: Ambulatory Visit | Attending: Obstetrics and Gynecology | Admitting: Obstetrics and Gynecology

## 2017-03-20 DIAGNOSIS — Z1231 Encounter for screening mammogram for malignant neoplasm of breast: Secondary | ICD-10-CM | POA: Diagnosis not present

## 2017-06-18 DIAGNOSIS — Z0000 Encounter for general adult medical examination without abnormal findings: Secondary | ICD-10-CM | POA: Diagnosis not present

## 2017-06-25 DIAGNOSIS — M5440 Lumbago with sciatica, unspecified side: Secondary | ICD-10-CM | POA: Diagnosis not present

## 2017-06-25 DIAGNOSIS — G8929 Other chronic pain: Secondary | ICD-10-CM | POA: Diagnosis not present

## 2017-06-25 DIAGNOSIS — E782 Mixed hyperlipidemia: Secondary | ICD-10-CM | POA: Diagnosis not present

## 2017-06-25 DIAGNOSIS — K589 Irritable bowel syndrome without diarrhea: Secondary | ICD-10-CM | POA: Diagnosis not present

## 2017-06-25 DIAGNOSIS — I10 Essential (primary) hypertension: Secondary | ICD-10-CM | POA: Diagnosis not present

## 2017-07-25 DIAGNOSIS — J301 Allergic rhinitis due to pollen: Secondary | ICD-10-CM | POA: Diagnosis not present

## 2017-07-25 DIAGNOSIS — H6123 Impacted cerumen, bilateral: Secondary | ICD-10-CM | POA: Diagnosis not present

## 2017-08-26 DIAGNOSIS — E782 Mixed hyperlipidemia: Secondary | ICD-10-CM | POA: Diagnosis not present

## 2017-08-26 DIAGNOSIS — I10 Essential (primary) hypertension: Secondary | ICD-10-CM | POA: Diagnosis not present

## 2017-09-03 DIAGNOSIS — I10 Essential (primary) hypertension: Secondary | ICD-10-CM | POA: Diagnosis not present

## 2017-09-03 DIAGNOSIS — M899 Disorder of bone, unspecified: Secondary | ICD-10-CM | POA: Diagnosis not present

## 2017-09-03 DIAGNOSIS — E782 Mixed hyperlipidemia: Secondary | ICD-10-CM | POA: Diagnosis not present

## 2017-09-03 DIAGNOSIS — R220 Localized swelling, mass and lump, head: Secondary | ICD-10-CM | POA: Diagnosis not present

## 2017-09-05 ENCOUNTER — Other Ambulatory Visit: Payer: Self-pay | Admitting: Internal Medicine

## 2017-09-05 DIAGNOSIS — R229 Localized swelling, mass and lump, unspecified: Secondary | ICD-10-CM

## 2017-09-19 ENCOUNTER — Ambulatory Visit
Admission: RE | Admit: 2017-09-19 | Discharge: 2017-09-19 | Disposition: A | Discharge status: Home / Self Care | Payer: PPO | Source: Ambulatory Visit | Attending: Internal Medicine | Admitting: Internal Medicine

## 2017-09-19 DIAGNOSIS — M899 Disorder of bone, unspecified: Secondary | ICD-10-CM | POA: Insufficient documentation

## 2017-09-19 DIAGNOSIS — R229 Localized swelling, mass and lump, unspecified: Secondary | ICD-10-CM | POA: Insufficient documentation

## 2017-09-19 DIAGNOSIS — R220 Localized swelling, mass and lump, head: Secondary | ICD-10-CM | POA: Diagnosis not present

## 2017-09-25 DIAGNOSIS — E782 Mixed hyperlipidemia: Secondary | ICD-10-CM | POA: Diagnosis not present

## 2017-09-25 DIAGNOSIS — R001 Bradycardia, unspecified: Secondary | ICD-10-CM | POA: Diagnosis not present

## 2017-09-25 DIAGNOSIS — I10 Essential (primary) hypertension: Secondary | ICD-10-CM | POA: Diagnosis not present

## 2017-10-07 DIAGNOSIS — H353112 Nonexudative age-related macular degeneration, right eye, intermediate dry stage: Secondary | ICD-10-CM | POA: Diagnosis not present

## 2017-11-18 DIAGNOSIS — L820 Inflamed seborrheic keratosis: Secondary | ICD-10-CM | POA: Diagnosis not present

## 2017-11-18 DIAGNOSIS — L720 Epidermal cyst: Secondary | ICD-10-CM | POA: Diagnosis not present

## 2017-11-18 DIAGNOSIS — D1800 Hemangioma unspecified site: Secondary | ICD-10-CM | POA: Diagnosis not present

## 2017-11-18 DIAGNOSIS — L812 Freckles: Secondary | ICD-10-CM | POA: Diagnosis not present

## 2017-11-18 DIAGNOSIS — L821 Other seborrheic keratosis: Secondary | ICD-10-CM | POA: Diagnosis not present

## 2017-11-18 DIAGNOSIS — L400 Psoriasis vulgaris: Secondary | ICD-10-CM | POA: Diagnosis not present

## 2017-11-18 DIAGNOSIS — I8310 Varicose veins of unspecified lower extremity with inflammation: Secondary | ICD-10-CM | POA: Diagnosis not present

## 2017-11-18 DIAGNOSIS — Z1283 Encounter for screening for malignant neoplasm of skin: Secondary | ICD-10-CM | POA: Diagnosis not present

## 2017-11-18 DIAGNOSIS — D2239 Melanocytic nevi of other parts of face: Secondary | ICD-10-CM | POA: Diagnosis not present

## 2017-11-18 DIAGNOSIS — L918 Other hypertrophic disorders of the skin: Secondary | ICD-10-CM | POA: Diagnosis not present

## 2017-12-24 DIAGNOSIS — I10 Essential (primary) hypertension: Secondary | ICD-10-CM | POA: Diagnosis not present

## 2017-12-31 DIAGNOSIS — R001 Bradycardia, unspecified: Secondary | ICD-10-CM | POA: Diagnosis not present

## 2017-12-31 DIAGNOSIS — Z23 Encounter for immunization: Secondary | ICD-10-CM | POA: Diagnosis not present

## 2017-12-31 DIAGNOSIS — E782 Mixed hyperlipidemia: Secondary | ICD-10-CM | POA: Diagnosis not present

## 2017-12-31 DIAGNOSIS — I10 Essential (primary) hypertension: Secondary | ICD-10-CM | POA: Diagnosis not present

## 2017-12-31 DIAGNOSIS — Z0000 Encounter for general adult medical examination without abnormal findings: Secondary | ICD-10-CM | POA: Diagnosis not present

## 2017-12-31 DIAGNOSIS — K589 Irritable bowel syndrome without diarrhea: Secondary | ICD-10-CM | POA: Diagnosis not present

## 2017-12-31 DIAGNOSIS — K219 Gastro-esophageal reflux disease without esophagitis: Secondary | ICD-10-CM | POA: Diagnosis not present

## 2018-01-16 DIAGNOSIS — K589 Irritable bowel syndrome without diarrhea: Secondary | ICD-10-CM | POA: Diagnosis not present

## 2018-01-16 DIAGNOSIS — E782 Mixed hyperlipidemia: Secondary | ICD-10-CM | POA: Diagnosis not present

## 2018-01-16 DIAGNOSIS — R072 Precordial pain: Secondary | ICD-10-CM | POA: Diagnosis not present

## 2018-01-16 DIAGNOSIS — I10 Essential (primary) hypertension: Secondary | ICD-10-CM | POA: Diagnosis not present

## 2018-02-05 ENCOUNTER — Other Ambulatory Visit: Payer: Self-pay | Admitting: Obstetrics and Gynecology

## 2018-02-05 DIAGNOSIS — Z01419 Encounter for gynecological examination (general) (routine) without abnormal findings: Secondary | ICD-10-CM | POA: Diagnosis not present

## 2018-02-05 DIAGNOSIS — Z1211 Encounter for screening for malignant neoplasm of colon: Secondary | ICD-10-CM | POA: Diagnosis not present

## 2018-02-05 DIAGNOSIS — Z1239 Encounter for other screening for malignant neoplasm of breast: Secondary | ICD-10-CM | POA: Diagnosis not present

## 2018-02-05 DIAGNOSIS — Z1231 Encounter for screening mammogram for malignant neoplasm of breast: Secondary | ICD-10-CM

## 2018-02-14 DIAGNOSIS — Z86010 Personal history of colonic polyps: Secondary | ICD-10-CM | POA: Diagnosis not present

## 2018-02-14 DIAGNOSIS — R1013 Epigastric pain: Secondary | ICD-10-CM | POA: Diagnosis not present

## 2018-03-21 ENCOUNTER — Ambulatory Visit
Admission: RE | Admit: 2018-03-21 | Discharge: 2018-03-21 | Disposition: A | Discharge status: Home / Self Care | Payer: PPO | Source: Ambulatory Visit | Attending: Obstetrics and Gynecology | Admitting: Obstetrics and Gynecology

## 2018-03-21 DIAGNOSIS — Z1231 Encounter for screening mammogram for malignant neoplasm of breast: Secondary | ICD-10-CM | POA: Diagnosis not present

## 2018-03-21 HISTORY — DX: Personal history of irradiation: Z92.3

## 2018-04-07 DIAGNOSIS — H2513 Age-related nuclear cataract, bilateral: Secondary | ICD-10-CM | POA: Diagnosis not present

## 2018-04-07 DIAGNOSIS — H353132 Nonexudative age-related macular degeneration, bilateral, intermediate dry stage: Secondary | ICD-10-CM | POA: Diagnosis not present

## 2018-04-11 ENCOUNTER — Encounter: Payer: Self-pay | Admitting: *Deleted

## 2018-04-14 ENCOUNTER — Ambulatory Visit
Admission: RE | Admit: 2018-04-14 | Discharge: 2018-04-14 | Disposition: A | Discharge status: Home / Self Care | Payer: PPO | Attending: Gastroenterology | Admitting: Gastroenterology

## 2018-04-14 ENCOUNTER — Encounter
Admission: RE | Disposition: A | Discharge status: Home / Self Care | Payer: Self-pay | Source: Home / Self Care | Attending: Gastroenterology

## 2018-04-14 ENCOUNTER — Ambulatory Visit: Payer: PPO | Admitting: Anesthesiology

## 2018-04-14 DIAGNOSIS — K210 Gastro-esophageal reflux disease with esophagitis: Secondary | ICD-10-CM | POA: Diagnosis not present

## 2018-04-14 DIAGNOSIS — Z1211 Encounter for screening for malignant neoplasm of colon: Secondary | ICD-10-CM | POA: Insufficient documentation

## 2018-04-14 DIAGNOSIS — K2960 Other gastritis without bleeding: Secondary | ICD-10-CM | POA: Diagnosis not present

## 2018-04-14 DIAGNOSIS — K449 Diaphragmatic hernia without obstruction or gangrene: Secondary | ICD-10-CM | POA: Diagnosis not present

## 2018-04-14 DIAGNOSIS — Z8719 Personal history of other diseases of the digestive system: Secondary | ICD-10-CM

## 2018-04-14 DIAGNOSIS — K2950 Unspecified chronic gastritis without bleeding: Secondary | ICD-10-CM | POA: Insufficient documentation

## 2018-04-14 DIAGNOSIS — Z86010 Personal history of colonic polyps: Secondary | ICD-10-CM | POA: Diagnosis not present

## 2018-04-14 DIAGNOSIS — Z881 Allergy status to other antibiotic agents status: Secondary | ICD-10-CM | POA: Insufficient documentation

## 2018-04-14 DIAGNOSIS — Z880 Allergy status to penicillin: Secondary | ICD-10-CM | POA: Insufficient documentation

## 2018-04-14 DIAGNOSIS — Z7982 Long term (current) use of aspirin: Secondary | ICD-10-CM | POA: Diagnosis not present

## 2018-04-14 DIAGNOSIS — Z7951 Long term (current) use of inhaled steroids: Secondary | ICD-10-CM | POA: Diagnosis not present

## 2018-04-14 DIAGNOSIS — K3189 Other diseases of stomach and duodenum: Secondary | ICD-10-CM | POA: Insufficient documentation

## 2018-04-14 DIAGNOSIS — K589 Irritable bowel syndrome without diarrhea: Secondary | ICD-10-CM | POA: Diagnosis not present

## 2018-04-14 DIAGNOSIS — R1013 Epigastric pain: Secondary | ICD-10-CM | POA: Diagnosis not present

## 2018-04-14 DIAGNOSIS — E785 Hyperlipidemia, unspecified: Secondary | ICD-10-CM | POA: Insufficient documentation

## 2018-04-14 DIAGNOSIS — Z79899 Other long term (current) drug therapy: Secondary | ICD-10-CM | POA: Diagnosis not present

## 2018-04-14 DIAGNOSIS — K5790 Diverticulosis of intestine, part unspecified, without perforation or abscess without bleeding: Secondary | ICD-10-CM | POA: Diagnosis not present

## 2018-04-14 DIAGNOSIS — Z882 Allergy status to sulfonamides status: Secondary | ICD-10-CM | POA: Diagnosis not present

## 2018-04-14 DIAGNOSIS — D123 Benign neoplasm of transverse colon: Secondary | ICD-10-CM | POA: Diagnosis not present

## 2018-04-14 DIAGNOSIS — K635 Polyp of colon: Secondary | ICD-10-CM | POA: Diagnosis not present

## 2018-04-14 DIAGNOSIS — Z923 Personal history of irradiation: Secondary | ICD-10-CM | POA: Insufficient documentation

## 2018-04-14 DIAGNOSIS — D125 Benign neoplasm of sigmoid colon: Secondary | ICD-10-CM | POA: Diagnosis not present

## 2018-04-14 DIAGNOSIS — Z853 Personal history of malignant neoplasm of breast: Secondary | ICD-10-CM | POA: Insufficient documentation

## 2018-04-14 DIAGNOSIS — I10 Essential (primary) hypertension: Secondary | ICD-10-CM | POA: Diagnosis not present

## 2018-04-14 DIAGNOSIS — D126 Benign neoplasm of colon, unspecified: Secondary | ICD-10-CM | POA: Diagnosis not present

## 2018-04-14 DIAGNOSIS — K5730 Diverticulosis of large intestine without perforation or abscess without bleeding: Secondary | ICD-10-CM | POA: Diagnosis not present

## 2018-04-14 HISTORY — DX: Essential (primary) hypertension: I10

## 2018-04-14 HISTORY — DX: Personal history of other diseases of the digestive system: Z87.19

## 2018-04-14 HISTORY — PX: COLONOSCOPY WITH PROPOFOL: SHX5780

## 2018-04-14 HISTORY — PX: ESOPHAGOGASTRODUODENOSCOPY (EGD) WITH PROPOFOL: SHX5813

## 2018-04-14 HISTORY — DX: Hyperlipidemia, unspecified: E78.5

## 2018-04-14 HISTORY — DX: Irritable bowel syndrome, unspecified: K58.9

## 2018-04-14 HISTORY — DX: Precordial pain: R07.2

## 2018-04-14 SURGERY — COLONOSCOPY WITH PROPOFOL
Anesthesia: General

## 2018-04-14 MED ORDER — SODIUM CHLORIDE 0.9 % IV SOLN: INTRAVENOUS | Status: DC

## 2018-04-14 MED ORDER — PROPOFOL 10 MG/ML IV BOLUS
INTRAVENOUS | Status: DC | PRN
  Administered 2018-04-14: 100 mg via INTRAVENOUS

## 2018-04-14 MED ORDER — FENTANYL CITRATE (PF) 100 MCG/2ML IJ SOLN
INTRAMUSCULAR | Status: AC
  Filled 2018-04-14: qty 2

## 2018-04-14 MED ORDER — SODIUM CHLORIDE 0.9 % IV SOLN
INTRAVENOUS | Status: DC
  Administered 2018-04-14: 1000 mL via INTRAVENOUS

## 2018-04-14 MED ORDER — LIDOCAINE 2% (20 MG/ML) 5 ML SYRINGE
INTRAMUSCULAR | Status: DC | PRN
  Administered 2018-04-14: 30 mg via INTRAVENOUS

## 2018-04-14 MED ORDER — FENTANYL CITRATE (PF) 100 MCG/2ML IJ SOLN
INTRAMUSCULAR | Status: DC | PRN
  Administered 2018-04-14 (×2): 50 ug via INTRAVENOUS

## 2018-04-14 MED ORDER — PROPOFOL 500 MG/50ML IV EMUL
INTRAVENOUS | Status: DC | PRN
  Administered 2018-04-14: 150 ug/kg/min via INTRAVENOUS

## 2018-04-14 NOTE — Anesthesia Preprocedure Evaluation (Signed)
Anesthesia Evaluation  Patient identified by MRN, date of birth, ID band Patient awake    Reviewed: Allergy & Precautions, H&P , NPO status , Patient's Chart, lab work & pertinent test results, reviewed documented beta blocker date and time   Airway Mallampati: II   Neck ROM: full    Dental  (+) Poor Dentition   Pulmonary neg pulmonary ROS,    Pulmonary exam normal        Cardiovascular Exercise Tolerance: Good hypertension, On Medications negative cardio ROS Normal cardiovascular exam Rhythm:regular Rate:Normal     Neuro/Psych negative neurological ROS  negative psych ROS   GI/Hepatic negative GI ROS, Neg liver ROS,   Endo/Other  negative endocrine ROS  Renal/GU negative Renal ROS  negative genitourinary   Musculoskeletal   Abdominal   Peds  Hematology negative hematology ROS (+)   Anesthesia Other Findings Past Medical History: No date: Benign essential hypertension 2003: Breast cancer ([place])     Comment:  Rt 2003: Cancer ([place])     Comment:  Rt. breast- radiation No date: Hyperlipidemia No date: IBS (irritable bowel syndrome) No date: Personal history of radiation therapy No date: Precordial pain Past Surgical History: No date: ABDOMINAL HYSTERECTOMY 2003: BREAST BIOPSY; Right     Comment:  + No date: BREAST LUMPECTOMY; Right     Comment:  f/u radiation  BMI    Body Mass Index:  37.87 kg/m     Reproductive/Obstetrics negative OB ROS                             Anesthesia Physical Anesthesia Plan  ASA: III  Anesthesia Plan: General   Post-op Pain Management:    Induction:   PONV Risk Score and Plan:   Airway Management Planned:   Additional Equipment:   Intra-op Plan:   Post-operative Plan:   Informed Consent: I have reviewed the patients History and Physical, chart, labs and discussed the procedure including the risks, benefits and alternatives for the  proposed anesthesia with the patient or authorized representative who has indicated his/her understanding and acceptance.     Dental Advisory Given  Plan Discussed with: CRNA  Anesthesia Plan Comments:         Anesthesia Quick Evaluation

## 2018-04-14 NOTE — Transfer of Care (Signed)
Immediate Anesthesia Transfer of Care Note  Patient: Cheryl Morris  Procedure(s) Performed: COLONOSCOPY WITH PROPOFOL (N/A ) ESOPHAGOGASTRODUODENOSCOPY (EGD) WITH PROPOFOL (N/A )  Patient Location: PACU and Endoscopy Unit  Anesthesia Type:General  Level of Consciousness: drowsy  Airway & Oxygen Therapy: Patient Spontanous Breathing and Patient connected to nasal cannula oxygen  Post-op Assessment: Report given to RN and Post -op Vital signs reviewed and stable  Post vital signs: Reviewed and stable  Last Vitals:  Vitals Value Taken Time  BP    Temp    Pulse 57 04/14/2018 10:57 AM  Resp 11 04/14/2018 10:57 AM  SpO2 99 % 04/14/2018 10:57 AM  Vitals shown include unvalidated device data.  Last Pain:  Vitals:   04/14/18 0855  TempSrc: Tympanic  PainSc: 0-No pain         Complications: No apparent anesthesia complications

## 2018-04-14 NOTE — H&P (Signed)
Outpatient short stay form Pre-procedure 04/14/2018 9:30 AM Lollie Sails MD  Primary Physician: Harrel Lemon, MD  Reason for visit: EGD and colonoscopy  History of present illness: Patient is a 78 year old female presenting today for an EGD and colonoscopy.  Her last colonoscopy was in December 2014.  Have a history of polyps.  Only hyperplastic polyps on that procedure.  However she is also been having some epigastric discomfort that will at times radiate toward her right upper quadrant.  She was started on a proton pump inhibitor about 6 weeks ago which has improved this.  No nausea vomiting or dysphagia.  She has no rectal bleeding diarrhea or abdominal pain otherwise.  Tolerated her prep well.  She takes 81 mg aspirin daily that is been held a couple days.  She takes no other aspirin products or blood thinning agent.    Current Facility-Administered Medications:  .  0.9 %  sodium chloride infusion, , Intravenous, Continuous, Lollie Sails, MD, Last Rate: 20 mL/hr at 04/14/18 0909 .  0.9 %  sodium chloride infusion, , Intravenous, Continuous, Lollie Sails, MD  Medications Prior to Admission  Medication Sig Dispense Refill Last Dose  . amLODipine (NORVASC) 5 MG tablet Take 10 mg by mouth daily.   04/14/2018 at [place]  . aspirin 81 MG chewable tablet Chew by mouth daily.   04/13/2018 at Unknown time  . cetirizine (ZYRTEC) 10 MG tablet Take 10 mg by mouth daily.   Past Week at Unknown time  . fluticasone (FLONASE) 50 MCG/ACT nasal spray Place into both nostrils daily.   Past Week at Unknown time  . losartan (COZAAR) 50 MG tablet Take 50 mg by mouth daily.   04/14/2018 at [place]  . pantoprazole (PROTONIX) 40 MG tablet Take 40 mg by mouth daily.   Past Week at Unknown time  . simvastatin (ZOCOR) 20 MG tablet Take 20 mg by mouth daily.   04/13/2018 at Unknown time     Allergies  Allergen Reactions  . Cefuroxime Axetil Other (See Comments)  . Naproxen Other (See Comments)  . Other  Hives    Pneumovac   . Penicillin V Potassium Other (See Comments)  . Sulfa Antibiotics Other (See Comments)     Past Medical History:  Diagnosis Date  . Benign essential hypertension   . Breast cancer ([place]) 2003   Rt  . Cancer ([place]) 2003   Rt. breast- radiation  . Hyperlipidemia   . IBS (irritable bowel syndrome)   . Personal history of radiation therapy   . Precordial pain     Review of systems:      Physical Exam    Heart and lungs: Regular rate and rhythm without rub or gallop, lungs are bilaterally clear.    HEENT: Normocephalic atraumatic eyes are anicteric    Other:    Pertinant exam for procedure: Soft nontender nondistended bowel sounds positive normoactive.    Planned proceedures: EGD, colonoscopy and indicated procedures. I have discussed the risks benefits and complications of procedures to include not limited to bleeding, infection, perforation and the risk of sedation and the patient wishes to proceed.    Lollie Sails, MD Gastroenterology 04/14/2018  9:30 AM

## 2018-04-14 NOTE — Op Note (Addendum)
[facility] Gastroenterology Patient Name: Cheryl Morris Procedure Date: 04/14/2018 9:21 AM MRN: [id number] Account #: [id number] Date of Birth: 1940-11-14 Admit Type: Outpatient Age: 78 Room: [facility] ENDO ROOM 2 Gender: Female Note Status: Finalized Procedure:            Colonoscopy Indications:          Personal history of colonic polyps Providers:            Lollie Sails, MD Medicines:            Monitored Anesthesia Care Complications:        No immediate complications. Procedure:            Pre-Anesthesia Assessment:                       - ASA Grade Assessment: III - A patient with severe                        systemic disease.                       After obtaining informed consent, the colonoscope was                        passed under direct vision. Throughout the procedure,                        the patient's blood pressure, pulse, and oxygen                        saturations were monitored continuously. The                        Colonoscope was introduced through the anus and                        advanced to the the cecum, identified by appendiceal                        orifice and ileocecal valve. The colonoscopy was                        performed with moderate difficulty due to a redundant                        colon and a tortuous colon. Successful completion of                        the procedure was aided by changing the patient to a                        supine position, changing the patient to a prone                        position and using manual pressure. The patient                        tolerated the procedure well. The quality of the bowel                        preparation was  good. Findings:      Multiple small-mouthed diverticula were found in the sigmoid colon and       descending colon.      A 6 mm polyp was found in the transverse colon. The polyp was sessile.       The polyp was removed with a cold snare. Resection  and retrieval were       complete.      A 10 mm polyp was found in the hepatic flexure. The polyp was sessile.       Polypectomy was attempted, initially using a cold snare. Polyp resection       was incomplete with this device. This intervention then required a       different device and polypectomy technique. The polyp was removed with a       cold biopsy forceps. Resection and retrieval were complete.      A 2 mm polyp was found in the sigmoid colon. The polyp was sessile. The       polyp was removed with a cold biopsy forceps. Resection and retrieval       were complete.      The retroflexed view of the distal rectum and anal verge was normal and       showed no anal or rectal abnormalities.      The digital rectal exam was normal. Impression:           - Diverticulosis in the sigmoid colon and in the                        descending colon.                       - One 6 mm polyp in the transverse colon, removed with                        a cold snare. Resected and retrieved.                       - One 10 mm polyp at the hepatic flexure, removed with                        a cold biopsy forceps. Resected and retrieved.                       - One 2 mm polyp in the sigmoid colon, removed with a                        cold biopsy forceps. Resected and retrieved. Recommendation:       - Discharge patient to home.                       - Soft diet today, then advance as tolerated to advance                        diet as tolerated. Procedure Code(s):    --- Professional ---                       [phone], Colonoscopy, flexible; with removal of tumor(s),  polyp(s), or other lesion(s) by snare technique                       45380, 59, Colonoscopy, flexible; with biopsy, single                        or multiple Diagnosis Code(s):    --- Professional ---                       D12.3, Benign neoplasm of transverse colon (hepatic                        flexure or  splenic flexure)                       D12.5, Benign neoplasm of sigmoid colon                       Z86.010, Personal history of colonic polyps                       K57.30, Diverticulosis of large intestine without                        perforation or abscess without bleeding CPT copyright 2018 American Medical [employer]. All rights reserved. The codes documented in this report are preliminary and upon coder review may  be revised to meet current compliance requirements. Lollie Sails, MD 04/14/2018 10:54:59 AM This report has been signed electronically. Number of Addenda: 0 Note Initiated On: 04/14/2018 9:21 AM Scope Withdrawal Time: 0 hours 8 minutes 18 seconds  Total Procedure Duration: 0 hours 38 minutes 2 seconds       [facility]

## 2018-04-14 NOTE — Anesthesia Post-op Follow-up Note (Signed)
Anesthesia QCDR form completed.        

## 2018-04-14 NOTE — Op Note (Signed)
[facility] Gastroenterology Patient Name: Cheryl Morris Procedure Date: 04/14/2018 9:42 AM MRN: [id number] Account #: [id number] Date of Birth: [birth year] Admit Type: Outpatient Age: 78 Room: [facility] ENDO ROOM 2 Gender: Female Note Status: Finalized Procedure:            Upper GI endoscopy Indications:          Epigastric abdominal pain, Dyspepsia Providers:            Lollie Sails, MD Referring MD:         Baxter Hire, MD (Referring MD) Medicines:            Monitored Anesthesia Care Complications:        No immediate complications. Procedure:            Pre-Anesthesia Assessment:                       - ASA Grade Assessment: III - A patient with severe                        systemic disease.                       After obtaining informed consent, the endoscope was                        passed under direct vision. Throughout the procedure,                        the patient's blood pressure, pulse, and oxygen                        saturations were monitored continuously. The Endoscope                        was introduced through the mouth, and advanced to the                        third part of duodenum. The upper GI endoscopy was                        accomplished without difficulty. The patient tolerated                        the procedure well. Findings:      A small hiatal hernia was present.      The Z-line was variable. Biopsies were taken with a cold forceps for       histology.      Patchy minimal inflammation characterized by erythema was found in the       gastric antrum. Biopsies were taken with a cold forceps for histology.      The cardia and gastric fundus were normal on retroflexion, otherwise.      A mild deformity was found in the second portion of the duodenum       associated with the opening of the bile duct and a likely auxillary       opening/ The mucosa is normal in appearance. The opening of each duct is       seen on  inspection.      Diffuse mucosal flattening was found in the third portion of the  duodenum. Biopsies were taken with a cold forceps for histology.      The cardia and gastric fundus were normal on retroflexion otherwise. Impression:           - Small hiatal hernia.                       - Z-line variable. Biopsied.                       - Gastritis. Biopsied.                       - Duodenal deformity.                       - Flattened mucosa was found in the duodenum. Biopsied. Recommendation:       - Await pathology results.                       - review LFTs and consider MRCP if abnormal.                       - Continue present medications.                       - Return to GI clinic in 4 weeks. Procedure Code(s):    --- Professional ---                       [phone], Esophagogastroduodenoscopy, flexible, transoral;                        with biopsy, single or multiple CPT copyright 2018 American Medical Association. All rights reserved. The codes documented in this report are preliminary and upon coder review may  be revised to meet current compliance requirements. Lollie Sails, MD 04/14/2018 10:09:44 AM This report has been signed electronically. Number of Addenda: 0 Note Initiated On: 04/14/2018 9:42 AM      [facility]

## 2018-04-14 NOTE — Anesthesia Postprocedure Evaluation (Signed)
Anesthesia Post Note  Patient: Cheryl Morris  Procedure(s) Performed: COLONOSCOPY WITH PROPOFOL (N/A ) ESOPHAGOGASTRODUODENOSCOPY (EGD) WITH PROPOFOL (N/A )  Patient location during evaluation: PACU Anesthesia Type: General Level of consciousness: awake and alert Pain management: pain level controlled Vital Signs Assessment: post-procedure vital signs reviewed and stable Respiratory status: spontaneous breathing, nonlabored ventilation, respiratory function stable and patient connected to nasal cannula oxygen Cardiovascular status: blood pressure returned to baseline and stable Postop Assessment: no apparent nausea or vomiting Anesthetic complications: no     Last Vitals:  Vitals:   04/14/18 1140 04/14/18 1150  BP: (!) 141/57 (!) 123/57  Pulse: (!) 48 (!) 50  Resp: 16 13  Temp:    SpO2: 99% 97%    Last Pain:  Vitals:   04/14/18 1150  TempSrc:   PainSc: 0-No pain                 Molli Barrows

## 2018-04-15 LAB — SURGICAL PATHOLOGY

## 2018-04-16 ENCOUNTER — Encounter: Payer: Self-pay | Admitting: Gastroenterology

## 2018-04-21 DIAGNOSIS — M25561 Pain in right knee: Secondary | ICD-10-CM | POA: Diagnosis not present

## 2018-04-21 DIAGNOSIS — M1711 Unilateral primary osteoarthritis, right knee: Secondary | ICD-10-CM | POA: Diagnosis not present

## 2018-04-30 DIAGNOSIS — H2511 Age-related nuclear cataract, right eye: Secondary | ICD-10-CM | POA: Diagnosis not present

## 2018-04-30 DIAGNOSIS — R001 Bradycardia, unspecified: Secondary | ICD-10-CM | POA: Diagnosis not present

## 2018-04-30 DIAGNOSIS — I10 Essential (primary) hypertension: Secondary | ICD-10-CM | POA: Diagnosis not present

## 2018-05-13 ENCOUNTER — Other Ambulatory Visit: Payer: Self-pay | Admitting: Physician Assistant

## 2018-05-13 DIAGNOSIS — M25561 Pain in right knee: Secondary | ICD-10-CM | POA: Diagnosis not present

## 2018-05-13 DIAGNOSIS — M25361 Other instability, right knee: Secondary | ICD-10-CM | POA: Diagnosis not present

## 2018-05-13 DIAGNOSIS — M2391 Unspecified internal derangement of right knee: Secondary | ICD-10-CM | POA: Diagnosis not present

## 2018-05-15 DIAGNOSIS — K210 Gastro-esophageal reflux disease with esophagitis: Secondary | ICD-10-CM | POA: Diagnosis not present

## 2018-05-15 DIAGNOSIS — D369 Benign neoplasm, unspecified site: Secondary | ICD-10-CM | POA: Diagnosis not present

## 2018-05-16 ENCOUNTER — Ambulatory Visit
Admission: RE | Admit: 2018-05-16 | Discharge: 2018-05-16 | Disposition: A | Discharge status: Home / Self Care | Payer: PPO | Source: Ambulatory Visit | Attending: Physician Assistant | Admitting: Physician Assistant

## 2018-05-16 DIAGNOSIS — M25561 Pain in right knee: Secondary | ICD-10-CM | POA: Diagnosis not present

## 2018-05-16 DIAGNOSIS — M25361 Other instability, right knee: Secondary | ICD-10-CM | POA: Diagnosis not present

## 2018-05-20 DIAGNOSIS — M2391 Unspecified internal derangement of right knee: Secondary | ICD-10-CM | POA: Diagnosis not present

## 2018-07-23 DIAGNOSIS — L03317 Cellulitis of buttock: Secondary | ICD-10-CM | POA: Diagnosis not present

## 2018-07-28 DIAGNOSIS — J301 Allergic rhinitis due to pollen: Secondary | ICD-10-CM | POA: Diagnosis not present

## 2018-07-28 DIAGNOSIS — H6123 Impacted cerumen, bilateral: Secondary | ICD-10-CM | POA: Diagnosis not present

## 2018-07-28 DIAGNOSIS — J349 Unspecified disorder of nose and nasal sinuses: Secondary | ICD-10-CM | POA: Diagnosis not present

## 2018-07-29 ENCOUNTER — Other Ambulatory Visit: Payer: Self-pay

## 2018-07-29 ENCOUNTER — Encounter: Payer: Self-pay | Admitting: *Deleted

## 2018-07-30 ENCOUNTER — Ambulatory Visit: Admit: 2018-07-30 | Payer: PPO | Admitting: Ophthalmology

## 2018-07-30 SURGERY — PHACOEMULSIFICATION, CATARACT, WITH IOL INSERTION
Anesthesia: Topical | Laterality: Right

## 2018-07-31 NOTE — Discharge Instructions (Signed)
General Anesthesia, Adult General anesthesia is the use of medicines to make a person "go to sleep" (unconscious) for a medical procedure. General anesthesia must be used for certain procedures, and is often recommended for procedures that:  Last a long time. Require you Cataract Surgery, Care After This sheet gives you information about how to care for yourself after your procedure. Your health care provider may also give you more specific instructions. If you have problems or questions, contact your health care provider. What can I expect after the procedure? After the procedure, it is common to have: Itching. Discomfort. Fluid discharge. Sensitivity to light and to touch. Bruising in or around the eye. Mild blurred vision. Follow these instructions at home: Eye care  Do not touch or rub your eyes. Protect your eyes as told by your health care provider. You may be told to wear a protective eye shield or sunglasses. Do not put a contact lens into the affected eye or eyes until your health care provider approves. Keep the area around your eye clean and dry: Avoid swimming. Do not allow water to hit you directly in the face while showering. Keep soap and shampoo out of your eyes. Check your eye every day for signs of infection. Watch for: Redness, swelling, or pain. Fluid, blood, or pus. Warmth. A bad smell. Vision that is getting worse. Sensitivity that is getting worse. Activity Do not drive for 24 hours if you were given a sedative during your procedure. Avoid strenuous activities, such as playing contact sports, for as long as told by your health care provider. Do not drive or use heavy machinery until your health care provider approves. Do not bend or lift heavy objects. Bending increases pressure in the eye. You can walk, climb stairs, and do light household chores. Ask your health care provider when you can return to work. If you work in a dusty environment, you may be  advised to wear protective eyewear for a period of time. General instructions Take or apply over-the-counter and prescription medicines only as told by your health care provider. This includes eye drops. Keep all follow-up visits as told by your health care provider. This is important. Contact a health care provider if: You have increased bruising around your eye. You have pain that is not helped with medicine. You have a fever. You have redness, swelling, or pain in your eye. You have fluid, blood, or pus coming from your incision. Your vision gets worse. Your sensitivity to light gets worse. Get help right away if: You have sudden loss of vision. You see flashes of light or spots (floaters). You have severe eye pain. You develop nausea or vomiting. Summary After your procedure, it is common to have itching, discomfort, bruising, fluid discharge, or sensitivity to light. Follow instructions from your health care provider about caring for your eye after the procedure. Do not rub your eye after the procedure. You may need to wear eye protection or sunglasses. Do not wear contact lenses. Keep the area around your eye clean and dry. Avoid activities that require a lot of effort. These include playing sports and lifting heavy objects. Contact a health care provider if you have increased bruising, pain that does not go away, or a fever. Get help right away if you suddenly lose your vision, see flashes of light or spots, or have severe pain in the eye. This information is not intended to replace advice given to you by your health care provider. Make sure you  discuss any questions you have with your health care provider. Document Released: 09/15/2004 Document Revised: 08/26/2017 Document Reviewed: 08/26/2017 Elsevier Interactive Patient Education  [employer].   to be still or in an unusual position.  Are major and can cause blood loss. The medicines used for general anesthesia are  called general anesthetics. As well as making you unconscious for a certain amount of time, these medicines:  Prevent pain.  Control your blood pressure.  Relax your muscles. Tell a health care provider about:  Any allergies you have.  All medicines you are taking, including vitamins, herbs, eye drops, creams, and over-the-counter medicines.  Any problems you or family members have had with anesthetic medicines.  Types of anesthetics you have had in the past.  Any blood disorders you have.  Any surgeries you have had.  Any medical conditions you have.  Any recent upper respiratory, chest, or ear infections.  Any history of: ? Heart or lung conditions, such as heart failure, sleep apnea, asthma, or chronic obstructive pulmonary disease (COPD). ? Armed forces logistics/support/administrative officer. ? Depression or anxiety.  Any tobacco or drug use, including marijuana or alcohol use.  Whether you are pregnant or may be pregnant. What are the risks? Generally, this is a safe procedure. However, problems may occur, including:  Allergic reaction.  Lung and heart problems.  Inhaling food or liquid from the stomach into the lungs (aspiration).  Nerve injury.  Dental injury.  Air in the bloodstream, which can lead to stroke.  Extreme agitation or confusion (delirium) when you wake up from the anesthetic.  Waking up during your procedure and being unable to move. This is rare. These problems are more likely to develop if you are having a major surgery or if you have an advanced or serious medical condition. You can prevent some of these complications by answering all of your health care provider's questions thoroughly and by following all instructions before your procedure. General anesthesia can cause side effects, including:  Nausea or vomiting.  A sore throat from the breathing tube.  Hoarseness.  Wheezing or coughing.  Shaking chills.  Tiredness.  Body aches.  Anxiety.  Sleepiness or  drowsiness.  Confusion or agitation. What happens before the procedure? Staying hydrated Follow instructions from your health care provider about hydration, which may include:  Up to 2 hours before the procedure - you may continue to drink clear liquids, such as water, clear fruit juice, black coffee, and plain tea.  Eating and drinking restrictions Follow instructions from your health care provider about eating and drinking, which may include:  8 hours before the procedure - stop eating heavy meals or foods such as meat, fried foods, or fatty foods.  6 hours before the procedure - stop eating light meals or foods, such as toast or cereal.  6 hours before the procedure - stop drinking milk or drinks that contain milk.  2 hours before the procedure - stop drinking clear liquids. Medicines Ask your health care provider about:  Changing or stopping your regular medicines. This is especially important if you are taking diabetes medicines or blood thinners.  Taking medicines such as aspirin and ibuprofen. These medicines can thin your blood. Do not take these medicines unless your health care provider tells you to take them.  Taking over-the-counter medicines, vitamins, herbs, and supplements. Do not take these during the week before your procedure unless your health care provider approves them. General instructions  Starting 3-6 weeks before the procedure, do not use  any products that contain nicotine or tobacco, such as cigarettes and e-cigarettes. If you need help quitting, ask your health care provider.  If you brush your teeth on the morning of the procedure, make sure to spit out all of the toothpaste.  Tell your health care provider if you become ill or develop a cold, cough, or fever.  If instructed by your health care provider, bring your sleep apnea device with you on the day of your surgery (if applicable).  Ask your health care provider if you will be going home the same  day, the following day, or after a longer hospital stay. ? Plan to have someone take you home from the hospital or clinic. ? Plan to have a responsible adult care for you for at least 24 hours after you leave the hospital or clinic. This is important. What happens during the procedure?   You will be given anesthetics through both of the following: ? A mask placed over your nose and mouth. ? An IV in one of your veins.  You may receive a medicine to help you relax (sedative).  After you are unconscious, a breathing tube may be inserted down your throat to help you breathe. This will be removed before you wake up.  An anesthesia specialist will stay with you throughout your procedure. He or she will: ? Keep you comfortable and safe by continuing to give you medicines and adjusting the amount of medicine that you get. ? Monitor your blood pressure, pulse, and oxygen levels to make sure that the anesthetics do not cause any problems. The procedure may vary among health care providers and hospitals. What happens after the procedure?  Your blood pressure, temperature, heart rate, breathing rate, and blood oxygen level will be monitored until the medicines you were given have worn off.  You will wake up in a recovery area. You may wake up slowly.  If you feel anxious or agitated, you may be given medicine to help you calm down.  If you will be going home the same day, your health care provider may check to make sure you can walk, drink, and urinate.  Your health care provider will treat any pain or side effects you have before you go home.  Do not drive for 24 hours if you were given a sedative. Summary  General anesthesia is used to keep you still and prevent pain during a procedure.  It is important to tell your health care provider about your medical history and any surgeries you have had, and previous experience with anesthesia.  Follow your health care provider's instructions about  when to stop eating, drinking, or taking certain medicines before your procedure.  Plan to have someone take you home from the hospital or clinic. This information is not intended to replace advice given to you by your health care provider. Make sure you discuss any questions you have with your health care provider. Document Released: 06/05/2007 Document Revised: 07/16/2017 Document Reviewed: 10/12/2016 Elsevier Interactive Patient Education  2019 [employer].

## 2018-08-01 ENCOUNTER — Encounter
Admission: RE | Admit: 2018-08-01 | Discharge: 2018-08-01 | Disposition: A | Discharge status: Home / Self Care | Payer: PPO | Source: Ambulatory Visit | Attending: Ophthalmology | Admitting: Ophthalmology

## 2018-08-01 ENCOUNTER — Other Ambulatory Visit: Payer: Self-pay

## 2018-08-01 DIAGNOSIS — Z01812 Encounter for preprocedural laboratory examination: Secondary | ICD-10-CM | POA: Insufficient documentation

## 2018-08-01 DIAGNOSIS — Z1159 Encounter for screening for other viral diseases: Secondary | ICD-10-CM | POA: Insufficient documentation

## 2018-08-02 LAB — NOVEL CORONAVIRUS, NAA (HOSP ORDER, SEND-OUT TO REF LAB; TAT 18-24 HRS): SARS-CoV-2, NAA: NOT DETECTED

## 2018-08-06 ENCOUNTER — Ambulatory Visit: Payer: PPO | Admitting: Anesthesiology

## 2018-08-06 ENCOUNTER — Encounter
Admission: RE | Disposition: A | Discharge status: Home / Self Care | Payer: Self-pay | Source: Home / Self Care | Attending: Ophthalmology

## 2018-08-06 ENCOUNTER — Ambulatory Visit
Admission: RE | Admit: 2018-08-06 | Discharge: 2018-08-06 | Disposition: A | Discharge status: Home / Self Care | Payer: PPO | Attending: Ophthalmology | Admitting: Ophthalmology

## 2018-08-06 DIAGNOSIS — H2511 Age-related nuclear cataract, right eye: Secondary | ICD-10-CM | POA: Insufficient documentation

## 2018-08-06 DIAGNOSIS — I10 Essential (primary) hypertension: Secondary | ICD-10-CM | POA: Insufficient documentation

## 2018-08-06 DIAGNOSIS — Z79899 Other long term (current) drug therapy: Secondary | ICD-10-CM | POA: Diagnosis not present

## 2018-08-06 DIAGNOSIS — H25811 Combined forms of age-related cataract, right eye: Secondary | ICD-10-CM | POA: Diagnosis not present

## 2018-08-06 HISTORY — DX: Gastro-esophageal reflux disease without esophagitis: K21.9

## 2018-08-06 HISTORY — DX: Personal history of other diseases of the digestive system: Z87.19

## 2018-08-06 HISTORY — PX: CATARACT EXTRACTION W/PHACO: SHX586

## 2018-08-06 HISTORY — DX: Unspecified osteoarthritis, unspecified site: M19.90

## 2018-08-06 HISTORY — DX: Nausea with vomiting, unspecified: R11.2

## 2018-08-06 HISTORY — DX: Nausea with vomiting, unspecified: Z98.890

## 2018-08-06 HISTORY — DX: Dizziness and giddiness: R42

## 2018-08-06 HISTORY — DX: Family history of other specified conditions: Z84.89

## 2018-08-06 HISTORY — DX: Cardiac arrhythmia, unspecified: I49.9

## 2018-08-06 SURGERY — PHACOEMULSIFICATION, CATARACT, WITH IOL INSERTION
Anesthesia: Monitor Anesthesia Care | Site: Eye | Laterality: Right

## 2018-08-06 MED ORDER — MOXIFLOXACIN HCL 0.5 % OP SOLN
1.0000 [drp] | OPHTHALMIC | Status: DC | PRN
  Administered 2018-08-06 (×3): 1 [drp] via OPHTHALMIC

## 2018-08-06 MED ORDER — ARMC OPHTHALMIC DILATING DROPS
1.0000 "application " | OPHTHALMIC | Status: DC | PRN
  Administered 2018-08-06 (×3): 1 via OPHTHALMIC

## 2018-08-06 MED ORDER — BRIMONIDINE TARTRATE-TIMOLOL 0.2-0.5 % OP SOLN
OPHTHALMIC | Status: DC | PRN
  Administered 2018-08-06: 1 [drp] via OPHTHALMIC

## 2018-08-06 MED ORDER — LACTATED RINGERS IV SOLN: INTRAVENOUS | Status: DC

## 2018-08-06 MED ORDER — MOXIFLOXACIN HCL 0.5 % OP SOLN
OPHTHALMIC | Status: DC | PRN
  Administered 2018-08-06: 0.4 mL via OPHTHALMIC

## 2018-08-06 MED ORDER — FENTANYL CITRATE (PF) 100 MCG/2ML IJ SOLN
INTRAMUSCULAR | Status: DC | PRN
  Administered 2018-08-06: 50 ug via INTRAVENOUS

## 2018-08-06 MED ORDER — MIDAZOLAM HCL 2 MG/2ML IJ SOLN
INTRAMUSCULAR | Status: DC | PRN
  Administered 2018-08-06: 1 mg via INTRAVENOUS

## 2018-08-06 MED ORDER — LIDOCAINE HCL (PF) 2 % IJ SOLN
INTRAOCULAR | Status: DC | PRN
  Administered 2018-08-06: 2 mL

## 2018-08-06 MED ORDER — EPINEPHRINE PF 1 MG/ML IJ SOLN
INTRAOCULAR | Status: DC | PRN
  Administered 2018-08-06: 69 mL via OPHTHALMIC

## 2018-08-06 MED ORDER — TETRACAINE HCL 0.5 % OP SOLN
1.0000 [drp] | OPHTHALMIC | Status: DC | PRN
  Administered 2018-08-06 (×3): 1 [drp] via OPHTHALMIC

## 2018-08-06 MED ORDER — NA HYALUR & NA CHOND-NA HYALUR 0.4-0.35 ML IO KIT
PACK | INTRAOCULAR | Status: DC | PRN
  Administered 2018-08-06: 1 mL via INTRAOCULAR

## 2018-08-06 SURGICAL SUPPLY — 20 items
CANNULA ANT/CHMB 27G (MISCELLANEOUS) ×1 IMPLANT
CANNULA ANT/CHMB 27GA (MISCELLANEOUS) ×3 IMPLANT
GLOVE SURG LX 7.5 STRW (GLOVE) ×2
GLOVE SURG LX STRL 7.5 STRW (GLOVE) ×1 IMPLANT
GLOVE SURG TRIUMPH 8.0 PF LTX (GLOVE) ×3 IMPLANT
GOWN STRL REUS W/ TWL LRG LVL3 (GOWN DISPOSABLE) ×2 IMPLANT
GOWN STRL REUS W/TWL LRG LVL3 (GOWN DISPOSABLE) ×6
LENS IOL TECNIS ITEC 21.0 (Intraocular Lens) ×2 IMPLANT
MARKER SKIN DUAL TIP RULER LAB (MISCELLANEOUS) ×3 IMPLANT
NDL FILTER BLUNT 18X1 1/2 (NEEDLE) ×1 IMPLANT
NEEDLE FILTER BLUNT 18X 1/2SAF (NEEDLE) ×2
NEEDLE FILTER BLUNT 18X1 1/2 (NEEDLE) ×1 IMPLANT
PACK CATARACT BRASINGTON (MISCELLANEOUS) ×3 IMPLANT
PACK EYE AFTER SURG (MISCELLANEOUS) ×3 IMPLANT
PACK OPTHALMIC (MISCELLANEOUS) ×3 IMPLANT
SYR 3ML LL SCALE MARK (SYRINGE) ×3 IMPLANT
SYR 5ML LL (SYRINGE) ×3 IMPLANT
SYR TB 1ML LUER SLIP (SYRINGE) ×3 IMPLANT
WATER STERILE IRR 500ML POUR (IV SOLUTION) ×3 IMPLANT
WIPE NON LINTING 3.25X3.25 (MISCELLANEOUS) ×3 IMPLANT

## 2018-08-06 NOTE — Transfer of Care (Signed)
Immediate Anesthesia Transfer of Care Note  Patient: Cheryl Morris  Procedure(s) Performed: CATARACT EXTRACTION PHACO AND INTRAOCULAR LENS PLACEMENT ([facility])  RIGHT (Right Eye)  Patient Location: PACU  Anesthesia Type: MAC  Level of Consciousness: awake, alert  and patient cooperative  Airway and Oxygen Therapy: Patient Spontanous Breathing and Patient connected to supplemental oxygen  Post-op Assessment: Post-op Vital signs reviewed, Patient's Cardiovascular Status Stable, Respiratory Function Stable, Patent Airway and No signs of Nausea or vomiting  Post-op Vital Signs: Reviewed and stable  Complications: No apparent anesthesia complications

## 2018-08-06 NOTE — H&P (Signed)

## 2018-08-06 NOTE — Anesthesia Postprocedure Evaluation (Signed)
Anesthesia Post Note  Patient: Cheryl Morris  Procedure(s) Performed: CATARACT EXTRACTION PHACO AND INTRAOCULAR LENS PLACEMENT ([place])  RIGHT (Right Eye)  Patient location during evaluation: PACU Anesthesia Type: MAC Level of consciousness: awake and alert Pain management: pain level controlled Vital Signs Assessment: post-procedure vital signs reviewed and stable Respiratory status: spontaneous breathing Cardiovascular status: stable Anesthetic complications: no    Jaci Standard, III,  Daniella Dewberry D

## 2018-08-06 NOTE — Op Note (Signed)
LOCATION:  [place]   PREOPERATIVE DIAGNOSIS:    Nuclear sclerotic cataract right eye. H25.11   POSTOPERATIVE DIAGNOSIS:  Nuclear sclerotic cataract right eye.     PROCEDURE:  Phacoemusification with posterior chamber intraocular lens placement of the right eye   LENS:   Implant Name Type Inv. Item Serial No. Manufacturer Lot No. LRB No. Used  LENS IOL DIOP 21.0 - [id number] Intraocular Lens LENS IOL DIOP 21.0 [id number]  Right 1        ULTRASOUND TIME: 12 % of 1 minutes, 13 seconds.  CDE 8.9   SURGEON:  Wyonia Hough, MD   ANESTHESIA:  Topical with tetracaine drops and 2% Xylocaine jelly, augmented with 1% preservative-free intracameral lidocaine.    COMPLICATIONS:  None.   DESCRIPTION OF PROCEDURE:  The patient was identified in the holding room and transported to the operating room and placed in the supine position under the operating microscope.  The right eye was identified as the operative eye and it was prepped and draped in the usual sterile ophthalmic fashion.   A 1 millimeter clear-corneal paracentesis was made at the 12:00 position.  0.5 ml of preservative-free 1% lidocaine was injected into the anterior chamber. The anterior chamber was filled with Viscoat viscoelastic.  A 2.4 millimeter keratome was used to make a near-clear corneal incision at the 9:00 position.  A curvilinear capsulorrhexis was made with a cystotome and capsulorrhexis forceps.  Balanced salt solution was used to hydrodissect and hydrodelineate the nucleus.   Phacoemulsification was then used in stop and chop fashion to remove the lens nucleus and epinucleus.  The remaining cortex was then removed using the irrigation and aspiration handpiece. Provisc was then placed into the capsular bag to distend it for lens placement.  A lens was then injected into the capsular bag.  The remaining viscoelastic was aspirated.   Wounds were hydrated with balanced salt solution.  The anterior  chamber was inflated to a physiologic pressure with balanced salt solution.  No wound leaks were noted. Cefuroxime 0.1 ml of a 10mg /ml solution was injected into the anterior chamber for a dose of 1 mg of intracameral antibiotic at the completion of the case.   Timolol and Brimonidine drops were applied to the eye.  The patient was taken to the recovery room in stable condition without complications of anesthesia or surgery.   Saprina Chuong 08/06/2018, 12:15 PM

## 2018-08-06 NOTE — Anesthesia Procedure Notes (Signed)
Procedure Name: MAC Date/Time: 08/06/2018 11:49 AM Performed by: Mayme Genta, CRNA Pre-anesthesia Checklist: Patient identified, Emergency Drugs available, Suction available, Timeout performed and Patient being monitored Patient Re-evaluated:Patient Re-evaluated prior to induction Oxygen Delivery Method: Nasal cannula Placement Confirmation: positive ETCO2

## 2018-08-06 NOTE — Anesthesia Preprocedure Evaluation (Signed)
Anesthesia Evaluation  Patient identified by MRN, date of birth, ID band Patient awake    Reviewed: Allergy & Precautions, H&P , NPO status , Patient's Chart, lab work & pertinent test results  Airway Mallampati: II  TM Distance: >3 FB Neck ROM: full    Dental no notable dental hx.    Pulmonary neg pulmonary ROS,    Pulmonary exam normal breath sounds clear to auscultation       Cardiovascular hypertension, On Medications Normal cardiovascular exam+ dysrhythmias      Neuro/Psych negative neurological ROS     GI/Hepatic Neg liver ROS, hiatal hernia, Medicated,  Endo/Other  negative endocrine ROS  Renal/GU negative Renal ROS  negative genitourinary   Musculoskeletal   Abdominal   Peds  Hematology negative hematology ROS (+)   Anesthesia Other Findings   Reproductive/Obstetrics                             Anesthesia Physical Anesthesia Plan  ASA: II  Anesthesia Plan: MAC   Post-op Pain Management:    Induction:   PONV Risk Score and Plan:   Airway Management Planned:   Additional Equipment:   Intra-op Plan:   Post-operative Plan:   Informed Consent: I have reviewed the patients History and Physical, chart, labs and discussed the procedure including the risks, benefits and alternatives for the proposed anesthesia with the patient or authorized representative who has indicated his/her understanding and acceptance.       Plan Discussed with:   Anesthesia Plan Comments:         Anesthesia Quick Evaluation

## 2018-08-07 ENCOUNTER — Encounter: Payer: Self-pay | Admitting: Ophthalmology

## 2018-08-08 DIAGNOSIS — I10 Essential (primary) hypertension: Secondary | ICD-10-CM | POA: Diagnosis not present

## 2018-08-08 DIAGNOSIS — E782 Mixed hyperlipidemia: Secondary | ICD-10-CM | POA: Diagnosis not present

## 2018-08-13 DIAGNOSIS — I10 Essential (primary) hypertension: Secondary | ICD-10-CM | POA: Diagnosis not present

## 2018-08-13 DIAGNOSIS — K589 Irritable bowel syndrome without diarrhea: Secondary | ICD-10-CM | POA: Diagnosis not present

## 2018-08-13 DIAGNOSIS — E782 Mixed hyperlipidemia: Secondary | ICD-10-CM | POA: Diagnosis not present

## 2018-08-19 DIAGNOSIS — H2512 Age-related nuclear cataract, left eye: Secondary | ICD-10-CM | POA: Diagnosis not present

## 2018-08-20 ENCOUNTER — Other Ambulatory Visit: Payer: Self-pay

## 2018-08-20 ENCOUNTER — Encounter: Payer: Self-pay | Admitting: *Deleted

## 2018-08-21 NOTE — Discharge Instructions (Signed)

## 2018-08-22 ENCOUNTER — Other Ambulatory Visit
Admission: RE | Admit: 2018-08-22 | Discharge: 2018-08-22 | Disposition: A | Discharge status: Home / Self Care | Payer: PPO | Source: Ambulatory Visit | Attending: Ophthalmology | Admitting: Ophthalmology

## 2018-08-22 ENCOUNTER — Other Ambulatory Visit: Payer: Self-pay

## 2018-08-22 DIAGNOSIS — Z1159 Encounter for screening for other viral diseases: Secondary | ICD-10-CM | POA: Insufficient documentation

## 2018-08-22 DIAGNOSIS — Z01812 Encounter for preprocedural laboratory examination: Secondary | ICD-10-CM | POA: Insufficient documentation

## 2018-08-23 LAB — NOVEL CORONAVIRUS, NAA (HOSP ORDER, SEND-OUT TO REF LAB; TAT 18-24 HRS): SARS-CoV-2, NAA: NOT DETECTED

## 2018-08-26 DIAGNOSIS — I10 Essential (primary) hypertension: Secondary | ICD-10-CM | POA: Diagnosis not present

## 2018-08-26 DIAGNOSIS — K589 Irritable bowel syndrome without diarrhea: Secondary | ICD-10-CM | POA: Diagnosis not present

## 2018-08-26 DIAGNOSIS — E782 Mixed hyperlipidemia: Secondary | ICD-10-CM | POA: Diagnosis not present

## 2018-08-26 DIAGNOSIS — Z0000 Encounter for general adult medical examination without abnormal findings: Secondary | ICD-10-CM | POA: Diagnosis not present

## 2018-08-27 ENCOUNTER — Ambulatory Visit: Payer: PPO | Admitting: Anesthesiology

## 2018-08-27 ENCOUNTER — Ambulatory Visit
Admission: RE | Admit: 2018-08-27 | Discharge: 2018-08-27 | Disposition: A | Discharge status: Home / Self Care | Payer: PPO | Attending: Ophthalmology | Admitting: Ophthalmology

## 2018-08-27 ENCOUNTER — Other Ambulatory Visit: Payer: Self-pay

## 2018-08-27 ENCOUNTER — Encounter: Payer: Self-pay | Admitting: Anesthesiology

## 2018-08-27 ENCOUNTER — Encounter
Admission: RE | Disposition: A | Discharge status: Home / Self Care | Payer: Self-pay | Source: Home / Self Care | Attending: Ophthalmology

## 2018-08-27 DIAGNOSIS — H2512 Age-related nuclear cataract, left eye: Secondary | ICD-10-CM | POA: Insufficient documentation

## 2018-08-27 DIAGNOSIS — H25812 Combined forms of age-related cataract, left eye: Secondary | ICD-10-CM | POA: Diagnosis not present

## 2018-08-27 DIAGNOSIS — Z79899 Other long term (current) drug therapy: Secondary | ICD-10-CM | POA: Insufficient documentation

## 2018-08-27 DIAGNOSIS — K449 Diaphragmatic hernia without obstruction or gangrene: Secondary | ICD-10-CM | POA: Diagnosis not present

## 2018-08-27 DIAGNOSIS — M810 Age-related osteoporosis without current pathological fracture: Secondary | ICD-10-CM | POA: Diagnosis not present

## 2018-08-27 DIAGNOSIS — K219 Gastro-esophageal reflux disease without esophagitis: Secondary | ICD-10-CM | POA: Insufficient documentation

## 2018-08-27 DIAGNOSIS — I10 Essential (primary) hypertension: Secondary | ICD-10-CM | POA: Insufficient documentation

## 2018-08-27 DIAGNOSIS — E7800 Pure hypercholesterolemia, unspecified: Secondary | ICD-10-CM | POA: Diagnosis not present

## 2018-08-27 DIAGNOSIS — M1990 Unspecified osteoarthritis, unspecified site: Secondary | ICD-10-CM | POA: Diagnosis not present

## 2018-08-27 DIAGNOSIS — I498 Other specified cardiac arrhythmias: Secondary | ICD-10-CM | POA: Diagnosis not present

## 2018-08-27 DIAGNOSIS — K589 Irritable bowel syndrome without diarrhea: Secondary | ICD-10-CM | POA: Insufficient documentation

## 2018-08-27 DIAGNOSIS — M419 Scoliosis, unspecified: Secondary | ICD-10-CM | POA: Insufficient documentation

## 2018-08-27 DIAGNOSIS — Z7982 Long term (current) use of aspirin: Secondary | ICD-10-CM | POA: Diagnosis not present

## 2018-08-27 HISTORY — PX: CATARACT EXTRACTION W/PHACO: SHX586

## 2018-08-27 SURGERY — PHACOEMULSIFICATION, CATARACT, WITH IOL INSERTION
Anesthesia: Monitor Anesthesia Care | Site: Eye | Laterality: Left

## 2018-08-27 MED ORDER — MOXIFLOXACIN HCL 0.5 % OP SOLN
OPHTHALMIC | Status: DC | PRN
  Administered 2018-08-27: 0.2 mL via OPHTHALMIC

## 2018-08-27 MED ORDER — LIDOCAINE HCL (PF) 2 % IJ SOLN
INTRAOCULAR | Status: DC | PRN
  Administered 2018-08-27: 1 mL

## 2018-08-27 MED ORDER — ARMC OPHTHALMIC DILATING DROPS
1.0000 "application " | OPHTHALMIC | Status: DC | PRN
  Administered 2018-08-27 (×3): 1 via OPHTHALMIC

## 2018-08-27 MED ORDER — EPINEPHRINE PF 1 MG/ML IJ SOLN
INTRAOCULAR | Status: DC | PRN
  Administered 2018-08-27: 13:00:00 72 mL via OPHTHALMIC

## 2018-08-27 MED ORDER — MIDAZOLAM HCL 2 MG/2ML IJ SOLN
INTRAMUSCULAR | Status: DC | PRN
  Administered 2018-08-27: 1 mg via INTRAVENOUS

## 2018-08-27 MED ORDER — FENTANYL CITRATE (PF) 100 MCG/2ML IJ SOLN
INTRAMUSCULAR | Status: DC | PRN
  Administered 2018-08-27: 50 ug via INTRAVENOUS

## 2018-08-27 MED ORDER — MOXIFLOXACIN HCL 0.5 % OP SOLN
1.0000 [drp] | OPHTHALMIC | Status: DC | PRN
  Administered 2018-08-27 (×3): 1 [drp] via OPHTHALMIC

## 2018-08-27 MED ORDER — BRIMONIDINE TARTRATE-TIMOLOL 0.2-0.5 % OP SOLN
OPHTHALMIC | Status: DC | PRN
  Administered 2018-08-27: 1 [drp] via OPHTHALMIC

## 2018-08-27 MED ORDER — TETRACAINE HCL 0.5 % OP SOLN
1.0000 [drp] | OPHTHALMIC | Status: DC | PRN
  Administered 2018-08-27 (×3): 1 [drp] via OPHTHALMIC

## 2018-08-27 MED ORDER — NA HYALUR & NA CHOND-NA HYALUR 0.4-0.35 ML IO KIT
PACK | INTRAOCULAR | Status: DC | PRN
  Administered 2018-08-27: 1 mL via INTRAOCULAR

## 2018-08-27 SURGICAL SUPPLY — 20 items
CANNULA ANT/CHMB 27G (MISCELLANEOUS) ×1 IMPLANT
CANNULA ANT/CHMB 27GA (MISCELLANEOUS) ×3 IMPLANT
GLOVE SURG LX 7.5 STRW (GLOVE) ×2
GLOVE SURG LX STRL 7.5 STRW (GLOVE) ×1 IMPLANT
GLOVE SURG TRIUMPH 8.0 PF LTX (GLOVE) ×3 IMPLANT
GOWN STRL REUS W/ TWL LRG LVL3 (GOWN DISPOSABLE) ×2 IMPLANT
GOWN STRL REUS W/TWL LRG LVL3 (GOWN DISPOSABLE) ×6
LENS IOL TECNIS ITEC 21.5 (Intraocular Lens) ×2 IMPLANT
MARKER SKIN DUAL TIP RULER LAB (MISCELLANEOUS) ×3 IMPLANT
NDL FILTER BLUNT 18X1 1/2 (NEEDLE) ×1 IMPLANT
NEEDLE FILTER BLUNT 18X 1/2SAF (NEEDLE) ×2
NEEDLE FILTER BLUNT 18X1 1/2 (NEEDLE) ×1 IMPLANT
PACK CATARACT BRASINGTON (MISCELLANEOUS) ×3 IMPLANT
PACK EYE AFTER SURG (MISCELLANEOUS) ×3 IMPLANT
PACK OPTHALMIC (MISCELLANEOUS) ×3 IMPLANT
SYR 3ML LL SCALE MARK (SYRINGE) ×3 IMPLANT
SYR 5ML LL (SYRINGE) ×3 IMPLANT
SYR TB 1ML LUER SLIP (SYRINGE) ×3 IMPLANT
WATER STERILE IRR 500ML POUR (IV SOLUTION) ×3 IMPLANT
WIPE NON LINTING 3.25X3.25 (MISCELLANEOUS) ×3 IMPLANT

## 2018-08-27 NOTE — Op Note (Signed)
OPERATIVE NOTE  Sumi Lye [id number] 08/27/2018   PREOPERATIVE DIAGNOSIS:  Nuclear sclerotic cataract left eye. H25.12   POSTOPERATIVE DIAGNOSIS:    Nuclear sclerotic cataract left eye.     PROCEDURE:  Phacoemusification with posterior chamber intraocular lens placement of the left eye   LENS:   Implant Name Type Inv. Item Serial No. Manufacturer Lot No. LRB No. Used Action  LENS IOL DIOP 21.5 - [id number] Intraocular Lens LENS IOL DIOP 21.5 [id number]  Left 1 Implanted        ULTRASOUND TIME: 13  % of 1 minutes 20 seconds, CDE 10.2  SURGEON:  Wyonia Hough, MD   ANESTHESIA:  Topical with tetracaine drops and 2% Xylocaine jelly, augmented with 1% preservative-free intracameral lidocaine.    COMPLICATIONS:  None.   DESCRIPTION OF PROCEDURE:  The patient was identified in the holding room and transported to the operating room and placed in the supine position under the operating microscope.  The left eye was identified as the operative eye and it was prepped and draped in the usual sterile ophthalmic fashion.   A 1 millimeter clear-corneal paracentesis was made at the 1:30 position.  0.5 ml of preservative-free 1% lidocaine was injected into the anterior chamber.  The anterior chamber was filled with Viscoat viscoelastic.  A 2.4 millimeter keratome was used to make a near-clear corneal incision at the 10:30 position.  .  A curvilinear capsulorrhexis was made with a cystotome and capsulorrhexis forceps.  Balanced salt solution was used to hydrodissect and hydrodelineate the nucleus.   Phacoemulsification was then used in stop and chop fashion to remove the lens nucleus and epinucleus.  The remaining cortex was then removed using the irrigation and aspiration handpiece. Provisc was then placed into the capsular bag to distend it for lens placement.  A lens was then injected into the capsular bag.  The remaining viscoelastic was aspirated.   Wounds were hydrated  with balanced salt solution.  The anterior chamber was inflated to a physiologic pressure with balanced salt solution.  No wound leaks were noted. Vigamox 0.2 ml of a 1mg  per ml solution was injected into the anterior chamber for a dose of 0.2 mg of intracameral antibiotic at the completion of the case.   Timolol and Brimonidine drops were applied to the eye.  The patient was taken to the recovery room in stable condition without complications of anesthesia or surgery.  Kamber Vignola 08/27/2018, 1:09 PM

## 2018-08-27 NOTE — Anesthesia Procedure Notes (Signed)
Procedure Name: MAC Date/Time: 08/27/2018 12:47 PM Performed by: Cameron Ali, CRNA Pre-anesthesia Checklist: Patient identified, Emergency Drugs available, Suction available, Timeout performed and Patient being monitored Patient Re-evaluated:Patient Re-evaluated prior to induction Oxygen Delivery Method: Nasal cannula Placement Confirmation: positive ETCO2

## 2018-08-27 NOTE — Anesthesia Preprocedure Evaluation (Signed)
Anesthesia Evaluation  Patient identified by MRN, date of birth, ID band Patient awake    Reviewed: NPO status   History of Anesthesia Complications (+) PONV and history of anesthetic complications  Airway Mallampati: II  TM Distance: >3 FB Neck ROM: full    Dental no notable dental hx.    Pulmonary neg pulmonary ROS,    Pulmonary exam normal        Cardiovascular Exercise Tolerance: Good hypertension, Normal cardiovascular exam     Neuro/Psych negative neurological ROS  negative psych ROS   GI/Hepatic Neg liver ROS, GERD  Controlled,IBS   Endo/Other  negative endocrine ROS  Renal/GU negative Renal ROS  negative genitourinary   Musculoskeletal   Abdominal   Peds  Hematology R BrCa > lumpectomy  (no IV/BP on R arm)   Anesthesia Other Findings Covid: NEG  Reproductive/Obstetrics                             Anesthesia Physical Anesthesia Plan  ASA: II  Anesthesia Plan: MAC   Post-op Pain Management:    Induction:   PONV Risk Score and Plan:   Airway Management Planned:   Additional Equipment:   Intra-op Plan:   Post-operative Plan:   Informed Consent: I have reviewed the patients History and Physical, chart, labs and discussed the procedure including the risks, benefits and alternatives for the proposed anesthesia with the patient or authorized representative who has indicated his/her understanding and acceptance.       Plan Discussed with: CRNA  Anesthesia Plan Comments:         Anesthesia Quick Evaluation

## 2018-08-27 NOTE — Transfer of Care (Signed)
Immediate Anesthesia Transfer of Care Note  Patient: Cheryl Morris  Procedure(s) Performed: CATARACT EXTRACTION PHACO AND INTRAOCULAR LENS PLACEMENT ([place])  LEFT (Left Eye)  Patient Location: PACU  Anesthesia Type: MAC  Level of Consciousness: awake, alert  and patient cooperative  Airway and Oxygen Therapy: Patient Spontanous Breathing and Patient connected to supplemental oxygen  Post-op Assessment: Post-op Vital signs reviewed, Patient's Cardiovascular Status Stable, Respiratory Function Stable, Patent Airway and No signs of Nausea or vomiting  Post-op Vital Signs: Reviewed and stable  Complications: No apparent anesthesia complications

## 2018-08-27 NOTE — H&P (Signed)

## 2018-08-27 NOTE — Anesthesia Postprocedure Evaluation (Signed)
Anesthesia Post Note  Patient: Cheryl Morris  Procedure(s) Performed: CATARACT EXTRACTION PHACO AND INTRAOCULAR LENS PLACEMENT ([place])  LEFT (Left Eye)  Patient location during evaluation: PACU Anesthesia Type: MAC Level of consciousness: awake and alert Pain management: pain level controlled Vital Signs Assessment: post-procedure vital signs reviewed and stable Respiratory status: spontaneous breathing, nonlabored ventilation, respiratory function stable and patient connected to nasal cannula oxygen Cardiovascular status: stable and blood pressure returned to baseline Postop Assessment: no apparent nausea or vomiting Anesthetic complications: no    Jewelz Ricklefs

## 2018-08-28 ENCOUNTER — Encounter: Payer: Self-pay | Admitting: Ophthalmology

## 2018-10-10 DIAGNOSIS — Z961 Presence of intraocular lens: Secondary | ICD-10-CM | POA: Diagnosis not present

## 2018-12-02 DIAGNOSIS — Z1283 Encounter for screening for malignant neoplasm of skin: Secondary | ICD-10-CM | POA: Diagnosis not present

## 2018-12-02 DIAGNOSIS — L820 Inflamed seborrheic keratosis: Secondary | ICD-10-CM | POA: Diagnosis not present

## 2018-12-02 DIAGNOSIS — D485 Neoplasm of uncertain behavior of skin: Secondary | ICD-10-CM | POA: Diagnosis not present

## 2018-12-02 DIAGNOSIS — L821 Other seborrheic keratosis: Secondary | ICD-10-CM | POA: Diagnosis not present

## 2018-12-02 DIAGNOSIS — D1800 Hemangioma unspecified site: Secondary | ICD-10-CM | POA: Diagnosis not present

## 2018-12-02 DIAGNOSIS — D2239 Melanocytic nevi of other parts of face: Secondary | ICD-10-CM | POA: Diagnosis not present

## 2018-12-19 DIAGNOSIS — I10 Essential (primary) hypertension: Secondary | ICD-10-CM | POA: Diagnosis not present

## 2018-12-26 DIAGNOSIS — I10 Essential (primary) hypertension: Secondary | ICD-10-CM | POA: Diagnosis not present

## 2018-12-26 DIAGNOSIS — E782 Mixed hyperlipidemia: Secondary | ICD-10-CM | POA: Diagnosis not present

## 2018-12-26 DIAGNOSIS — K589 Irritable bowel syndrome without diarrhea: Secondary | ICD-10-CM | POA: Diagnosis not present

## 2018-12-26 DIAGNOSIS — Z23 Encounter for immunization: Secondary | ICD-10-CM | POA: Diagnosis not present

## 2018-12-26 DIAGNOSIS — Z0001 Encounter for general adult medical examination with abnormal findings: Secondary | ICD-10-CM | POA: Diagnosis not present

## 2018-12-26 DIAGNOSIS — Z13820 Encounter for screening for osteoporosis: Secondary | ICD-10-CM | POA: Diagnosis not present

## 2018-12-31 DIAGNOSIS — Z780 Asymptomatic menopausal state: Secondary | ICD-10-CM | POA: Diagnosis not present

## 2019-01-29 DIAGNOSIS — E782 Mixed hyperlipidemia: Secondary | ICD-10-CM | POA: Diagnosis not present

## 2019-01-29 DIAGNOSIS — I10 Essential (primary) hypertension: Secondary | ICD-10-CM | POA: Diagnosis not present

## 2019-02-11 ENCOUNTER — Other Ambulatory Visit: Payer: Self-pay | Admitting: Certified Nurse Midwife

## 2019-02-11 DIAGNOSIS — Z01419 Encounter for gynecological examination (general) (routine) without abnormal findings: Secondary | ICD-10-CM | POA: Diagnosis not present

## 2019-02-11 DIAGNOSIS — Z1231 Encounter for screening mammogram for malignant neoplasm of breast: Secondary | ICD-10-CM

## 2019-02-11 DIAGNOSIS — Z1331 Encounter for screening for depression: Secondary | ICD-10-CM | POA: Diagnosis not present

## 2019-03-11 DIAGNOSIS — J019 Acute sinusitis, unspecified: Secondary | ICD-10-CM | POA: Diagnosis not present

## 2019-03-23 ENCOUNTER — Ambulatory Visit
Admission: RE | Admit: 2019-03-23 | Discharge: 2019-03-23 | Disposition: A | Discharge status: Home / Self Care | Payer: PPO | Source: Ambulatory Visit | Attending: Certified Nurse Midwife | Admitting: Certified Nurse Midwife

## 2019-03-23 DIAGNOSIS — Z1231 Encounter for screening mammogram for malignant neoplasm of breast: Secondary | ICD-10-CM | POA: Diagnosis not present

## 2019-04-03 DIAGNOSIS — J019 Acute sinusitis, unspecified: Secondary | ICD-10-CM | POA: Diagnosis not present

## 2019-04-03 DIAGNOSIS — B9689 Other specified bacterial agents as the cause of diseases classified elsewhere: Secondary | ICD-10-CM | POA: Diagnosis not present

## 2019-04-09 DIAGNOSIS — H353132 Nonexudative age-related macular degeneration, bilateral, intermediate dry stage: Secondary | ICD-10-CM | POA: Diagnosis not present

## 2019-04-16 DIAGNOSIS — I10 Essential (primary) hypertension: Secondary | ICD-10-CM | POA: Diagnosis not present

## 2019-04-16 DIAGNOSIS — R040 Epistaxis: Secondary | ICD-10-CM | POA: Diagnosis not present

## 2019-04-21 DIAGNOSIS — I10 Essential (primary) hypertension: Secondary | ICD-10-CM | POA: Diagnosis not present

## 2019-04-28 DIAGNOSIS — Z0000 Encounter for general adult medical examination without abnormal findings: Secondary | ICD-10-CM | POA: Diagnosis not present

## 2019-04-28 DIAGNOSIS — K589 Irritable bowel syndrome without diarrhea: Secondary | ICD-10-CM | POA: Diagnosis not present

## 2019-04-28 DIAGNOSIS — I10 Essential (primary) hypertension: Secondary | ICD-10-CM | POA: Diagnosis not present

## 2019-04-28 DIAGNOSIS — E782 Mixed hyperlipidemia: Secondary | ICD-10-CM | POA: Diagnosis not present

## 2019-05-26 ENCOUNTER — Ambulatory Visit: Payer: PPO | Admitting: Dermatology

## 2019-05-26 ENCOUNTER — Encounter: Payer: Self-pay | Admitting: Dermatology

## 2019-05-26 ENCOUNTER — Other Ambulatory Visit: Payer: Self-pay

## 2019-05-26 ENCOUNTER — Encounter (INDEPENDENT_AMBULATORY_CARE_PROVIDER_SITE_OTHER): Payer: Self-pay

## 2019-05-26 DIAGNOSIS — D492 Neoplasm of unspecified behavior of bone, soft tissue, and skin: Secondary | ICD-10-CM

## 2019-05-26 DIAGNOSIS — L738 Other specified follicular disorders: Secondary | ICD-10-CM | POA: Diagnosis not present

## 2019-05-26 DIAGNOSIS — L578 Other skin changes due to chronic exposure to nonionizing radiation: Secondary | ICD-10-CM | POA: Diagnosis not present

## 2019-05-26 DIAGNOSIS — D2239 Melanocytic nevi of other parts of face: Secondary | ICD-10-CM | POA: Diagnosis not present

## 2019-05-26 DIAGNOSIS — L720 Epidermal cyst: Secondary | ICD-10-CM | POA: Diagnosis not present

## 2019-05-26 DIAGNOSIS — L409 Psoriasis, unspecified: Secondary | ICD-10-CM

## 2019-05-26 DIAGNOSIS — D485 Neoplasm of uncertain behavior of skin: Secondary | ICD-10-CM | POA: Diagnosis not present

## 2019-05-26 DIAGNOSIS — D229 Melanocytic nevi, unspecified: Secondary | ICD-10-CM

## 2019-05-26 MED ORDER — FLUOCINONIDE 0.05 % EX SOLN: CUTANEOUS | 3 refills | Status: DC

## 2019-05-26 NOTE — Progress Notes (Signed)
   Follow-Up Visit   Subjective  Cheryl Morris is a 79 y.o. 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  The following portions of the chart were reviewed this encounter and updated as appropriate:     Review of Systems: No other skin or systemic complaints.  Objective  Well appearing patient in no apparent distress; mood and affect are within normal limits.  A focused examination was performed including head, including the scalp, face, neck, nose, ears, eyelids, and lips and R thigh. Relevant physical exam findings are noted in the Assessment and Plan.  Objective  Face: Diffuse scaly erythematous macules with underlying dyspigmentation.   Objective  Right Medial Thigh: Firm blue white sub q nodule 5.88mm  Objective  Left Temple: 5.34mm pink yellow papule Sebaceous Hyperplasia vs Nevus r/o Atypia   Objective  Right Anterior Jaw: 4.70mm flesh papule  Objective  Left Occipital Scalp: Well-demarcated erythematous papules/plaques with silvery scale.   Objective  Right Jaw at Mandible: Pink scar, BX proven sebaceous neoplasm with atypical features.   Assessment & Plan  Actinic skin damage Face  Recommend daily use of broad spectrum spf 30+ sunscreen   Epidermal cyst Right Medial Thigh  Benign, observe.  Discussed surgery if becomes symptomatic.   Neoplasm of skin Left Temple  Skin / nail biopsy Type of biopsy: tangential   Informed consent: discussed and consent obtained   Anesthesia: the lesion was anesthetized in a standard fashion   Anesthetic:  1% lidocaine w/ epinephrine 1-100,000 buffered w/ 8.4% NaHCO3 Instrument used: flexible razor blade   Hemostasis achieved with: aluminum chloride and electrodesiccation   Outcome: patient tolerated procedure well   Post-procedure details: wound care instructions given   Additional  details:  Mupirocin ointment and band-aid applied  Specimen 1 - Surgical pathology Differential Diagnosis: Sebaceous Hyperplasia vs Nevus r/o Atypia Check Margins: No 5.57mm pink yellow papule 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    Psoriasis Left Occipital Scalp  Continue fluocinonide solution qhs PRN flares Continue T sal shampoo  Sebaceous hyperplasia Right Jaw at Mandible  Benign, observe.

## 2019-05-26 NOTE — Patient Instructions (Addendum)
Biopsy Wound Care Instructions  1. Leave the original bandage on for 24 hours if possible.  If the bandage becomes soaked or soiled before that time, it is OK to remove it and examine the wound.  A small amount of post-operative bleeding is normal.  If excessive bleeding occurs, remove the bandage, place gauze over the site and apply continuous pressure (no peeking) over the area for 30 minutes. If this does not work, please call our clinic as soon as possible or page your doctor if it is after hours.   2. Once a day, cleanse the wound with soap and water. It is fine to shower. If a thick crust develops you may use a Q-tip dipped into dilute hydrogen peroxide (mix 1:1 with water) to dissolve it.  Hydrogen peroxide can slow the healing process, so use it only as needed.    3. After washing, apply petroleum jelly (Vaseline) or an antibiotic ointment if your doctor prescribed one for you, followed by a bandage.    4. For best healing, the wound should be covered with a layer of ointment at all times. If you are not able to keep the area covered with a bandage to hold the ointment in place, this may mean re-applying the ointment several times a day.  Continue this wound care until the wound has healed and is no longer open.   Itching and mild discomfort is normal during the healing process. However, if you develop pain or severe itching, please call our office.   If you have any discomfort, you can take Tylenol (acetaminophen) or ibuprofen as directed on the bottle. (Please do not take these if you have an allergy to them or cannot take them for another reason).  Some redness, tenderness and white or yellow material in the wound is normal healing.  If the area becomes very sore and red, or develops a thick yellow-green material (pus), it may be infected; please notify us.    If you have stitches, return to clinic as directed to have the stitches removed. You will continue wound care for 2-3 days after  the stitches are removed.   Wound healing continues for up to one year following surgery. It is not unusual to experience pain in the scar from time to time during the interval.  If the pain becomes severe or the scar thickens, you should notify the office.    A slight amount of redness in a scar is expected for the first six months.  After six months, the redness will fade and the scar will soften and fade.  The color difference becomes less noticeable with time.  If there are any problems, return for a post-op surgery check at your earliest convenience.  To improve the appearance of the scar, you can use silicone scar gel, cream, or sheets (such as Mederma or Serica) every night for up to one year. These are available over the counter (without a prescription).  Please call our office for any questions or concerns.

## 2019-05-28 ENCOUNTER — Telehealth: Payer: Self-pay

## 2019-05-28 NOTE — Telephone Encounter (Signed)
Advised pt of bx results/sh ?

## 2019-05-28 NOTE — Telephone Encounter (Signed)
-----   Message from Brendolyn Patty, MD sent at 05/28/2019  8:51 AM EDT ----- Benign nevus - please call pt

## 2019-08-18 DIAGNOSIS — I10 Essential (primary) hypertension: Secondary | ICD-10-CM | POA: Diagnosis not present

## 2019-08-25 DIAGNOSIS — I10 Essential (primary) hypertension: Secondary | ICD-10-CM | POA: Diagnosis not present

## 2019-08-25 DIAGNOSIS — K589 Irritable bowel syndrome without diarrhea: Secondary | ICD-10-CM | POA: Diagnosis not present

## 2019-08-25 DIAGNOSIS — E782 Mixed hyperlipidemia: Secondary | ICD-10-CM | POA: Diagnosis not present

## 2019-09-23 DIAGNOSIS — M4727 Other spondylosis with radiculopathy, lumbosacral region: Secondary | ICD-10-CM | POA: Diagnosis not present

## 2019-09-23 DIAGNOSIS — M4726 Other spondylosis with radiculopathy, lumbar region: Secondary | ICD-10-CM | POA: Diagnosis not present

## 2019-09-23 DIAGNOSIS — M4316 Spondylolisthesis, lumbar region: Secondary | ICD-10-CM | POA: Diagnosis not present

## 2019-09-23 DIAGNOSIS — I700 Atherosclerosis of aorta: Secondary | ICD-10-CM | POA: Diagnosis not present

## 2019-09-23 DIAGNOSIS — M5432 Sciatica, left side: Secondary | ICD-10-CM | POA: Diagnosis not present

## 2019-10-09 DIAGNOSIS — H353132 Nonexudative age-related macular degeneration, bilateral, intermediate dry stage: Secondary | ICD-10-CM | POA: Diagnosis not present

## 2019-12-08 ENCOUNTER — Ambulatory Visit: Payer: PPO | Admitting: Dermatology

## 2019-12-08 ENCOUNTER — Other Ambulatory Visit: Payer: Self-pay

## 2019-12-08 DIAGNOSIS — L738 Other specified follicular disorders: Secondary | ICD-10-CM

## 2019-12-08 DIAGNOSIS — L814 Other melanin hyperpigmentation: Secondary | ICD-10-CM | POA: Diagnosis not present

## 2019-12-08 DIAGNOSIS — L720 Epidermal cyst: Secondary | ICD-10-CM | POA: Diagnosis not present

## 2019-12-08 DIAGNOSIS — Z1283 Encounter for screening for malignant neoplasm of skin: Secondary | ICD-10-CM

## 2019-12-08 DIAGNOSIS — C4491 Basal cell carcinoma of skin, unspecified: Secondary | ICD-10-CM

## 2019-12-08 DIAGNOSIS — M898X9 Other specified disorders of bone, unspecified site: Secondary | ICD-10-CM | POA: Diagnosis not present

## 2019-12-08 DIAGNOSIS — L820 Inflamed seborrheic keratosis: Secondary | ICD-10-CM

## 2019-12-08 DIAGNOSIS — C44311 Basal cell carcinoma of skin of nose: Secondary | ICD-10-CM

## 2019-12-08 DIAGNOSIS — L821 Other seborrheic keratosis: Secondary | ICD-10-CM

## 2019-12-08 DIAGNOSIS — L409 Psoriasis, unspecified: Secondary | ICD-10-CM | POA: Diagnosis not present

## 2019-12-08 DIAGNOSIS — D485 Neoplasm of uncertain behavior of skin: Secondary | ICD-10-CM

## 2019-12-08 DIAGNOSIS — D1800 Hemangioma unspecified site: Secondary | ICD-10-CM

## 2019-12-08 DIAGNOSIS — L578 Other skin changes due to chronic exposure to nonionizing radiation: Secondary | ICD-10-CM

## 2019-12-08 HISTORY — DX: Basal cell carcinoma of skin, unspecified: C44.91

## 2019-12-08 MED ORDER — FLUOCINONIDE 0.05 % EX SOLN: CUTANEOUS | 3 refills | Status: DC

## 2019-12-08 NOTE — Patient Instructions (Signed)
Wound Care Instructions ° °1. Cleanse wound gently with soap and water once a day then pat dry with clean gauze. Apply a thing coat of Petrolatum (petroleum jelly, "Vaseline") over the wound (unless you have an allergy to this). We recommend that you use a new, sterile tube of Vaseline. Do not pick or remove scabs. Do not remove the yellow or white "healing tissue" from the base of the wound. ° °2. Cover the wound with fresh, clean, nonstick gauze and secure with paper tape. You may use Band-Aids in place of gauze and tape if the would is small enough, but would recommend trimming much of the tape off as there is often too much. Sometimes Band-Aids can irritate the skin. ° °3. You should call the office for your biopsy report after 1 week if you have not already been contacted. ° °4. If you experience any problems, such as abnormal amounts of bleeding, swelling, significant bruising, significant pain, or evidence of infection, please call the office immediately. ° °Cryotherapy Aftercare ° °• Wash gently with soap and water everyday.   °• Apply Vaseline and Band-Aid daily until healed. ° ° °

## 2019-12-08 NOTE — Progress Notes (Signed)
Follow-Up Visit   Subjective  Cheryl Morris is a 79 y.o. female who presents for the following: Annual Exam (TBSE). 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 She is using fluocinonide solution prn itch and T-Sal Shampoo about one time a week. She has a couple of spots in her groin that she noticed recently. One in the pantyline gets irritated.  She also has several other scaly spots that she tends to pick that she will point out. She has a new spot on her nose x 2 months that scabs.  She has a h/o atypical sebaceous lesion on R jaw.     The following portions of the chart were reviewed this encounter and updated as appropriate:      Review of Systems:  No other skin or systemic complaints except as noted in HPI or Assessment and Plan.  Objective  Well appearing patient in no apparent distress; mood and affect are within normal limits.  A full examination was performed including scalp, head, eyes, ears, nose, lips, neck, chest, axillae, abdomen, back, buttocks, bilateral upper extremities, bilateral lower extremities, hands, feet, fingers, toes, fingernails, and toenails. All findings within normal limits unless otherwise noted below.  Objective  Right Jaw at Mandible: Clear today  Objective  Left Upper Calf x 1, Left lower neck x 1, Left posterior upper arm x 2, Spinal lower back x 1, L inguinal x 1 (6): Erythematous keratotic or waxy stuck-on papule.   Objective  L labia majora, R upper medial thigh: Small firm white papule of the left labia majora.  Firm blue gray papule of the right upper medial thigh.  Objective  Occipital Scalp: Pink scaly plaque  Objective  Right Frontal Scalp: Bony nodule, 3.0 cm Present since 79 years old, no changes. X-rayed in the past twice.  Objective  Left Nasal Dorsum: 4.40mm pink flesh papule       Assessment & Plan   Skin cancer screening performed today.  Actinic Damage - diffuse scaly erythematous  macules with underlying dyspigmentation - Recommend daily broad spectrum sunscreen SPF 30+ to sun-exposed areas, reapply every 2 hours as needed.  - Call for new or changing lesions.  Seborrheic Keratoses - Stuck-on, waxy, tan-brown papules and plaques  - Discussed benign etiology and prognosis. 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 Observe for recurrence. Call clinic for new or changing lesions.  Recommend regular skin exams, daily broad-spectrum spf 30+ sunscreen use, and photoprotection.     Inflamed seborrheic keratosis (6) Left Upper Calf x 1, Left lower neck x 1, Left posterior upper arm x 2, Spinal lower back x 1, L inguinal x 1  Cryotherapy today x 6  Destruction of lesion - Left Upper Calf x 1, Left lower neck x 1, Left posterior upper arm x 2, Spinal lower back x 1, L inguinal x 1  Destruction method: cryotherapy   Informed consent: discussed and consent obtained   Lesion destroyed using liquid nitrogen: Yes   Region frozen until ice ball extended beyond lesion: Yes   Outcome: patient tolerated procedure well with no complications   Post-procedure details:  wound care instructions given    Epidermal inclusion cyst L labia majora, R upper medial thigh  Reassured benign growth.  Recommend observation.  Discussed surgical excision in office if changes noted or symptomatic.   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  Continue T-Sal shampoo, let sit several minutes before rinsing.  Discussed other treatment options if bothersome and not improving,  X-trac laser, biologics, other topicals. Not bothersome to patient at this time.  Reordered Medications fluocinonide (LIDEX) 0.05 % external solution  Bony prominence Right Frontal Scalp  Benign, observe. Stable.    Neoplasm of uncertain behavior of skin Left Nasal Dorsum  Skin / nail biopsy Type of biopsy: tangential   Informed consent: discussed and consent obtained   Patient was prepped and draped in usual sterile fashion: Area prepped with alcohol. Anesthesia: the lesion was anesthetized in a standard fashion   Anesthetic:  1% lidocaine w/ epinephrine 1-100,000 buffered w/ 8.4% NaHCO3 Instrument used: flexible razor blade   Hemostasis achieved with: pressure, aluminum chloride and electrodesiccation   Outcome: patient tolerated procedure well   Post-procedure details: wound care instructions given   Post-procedure details comment:  Ointment and small bandage applied  Specimen 1 - Surgical pathology Differential Diagnosis: Sebaceous Hyperplasia vs Folliculitis r/o BCC Check Margins: No 4.36mm pink flesh papule   If positive, discussed EDC vs MOHS.  Return in about 6 months (around 06/06/2020) for sun exposed areas.  IJamesetta Orleans, CMA, am acting as scribe for Brendolyn Patty, MD .  Documentation: I have reviewed the above documentation for accuracy and completeness, and I agree with the above.  Brendolyn Patty MD

## 2019-12-14 ENCOUNTER — Other Ambulatory Visit: Payer: Self-pay

## 2019-12-14 ENCOUNTER — Telehealth: Payer: Self-pay

## 2019-12-14 DIAGNOSIS — C44311 Basal cell carcinoma of skin of nose: Secondary | ICD-10-CM

## 2019-12-14 NOTE — Telephone Encounter (Signed)
-----   Message from Brendolyn Patty, MD sent at 12/14/2019  9:55 AM EDT ----- Skin , left nasal dorsum BASAL CELL CARCINOMA, SUPERFICIAL AND NODULAR PATTERNS  BCC- recommend Mohs at [facility]

## 2019-12-14 NOTE — Telephone Encounter (Signed)
Lft pt msg to call for bx results/sh °

## 2019-12-14 NOTE — Progress Notes (Signed)
Referral to [facility] for [facility] surgery placed.

## 2019-12-15 NOTE — Telephone Encounter (Signed)
Spoke with pt and informed her of results. Placed referral to [place] for [facility] surgery.

## 2019-12-22 DIAGNOSIS — I10 Essential (primary) hypertension: Secondary | ICD-10-CM | POA: Diagnosis not present

## 2019-12-22 DIAGNOSIS — E782 Mixed hyperlipidemia: Secondary | ICD-10-CM | POA: Diagnosis not present

## 2019-12-29 DIAGNOSIS — Z23 Encounter for immunization: Secondary | ICD-10-CM | POA: Diagnosis not present

## 2019-12-29 DIAGNOSIS — I10 Essential (primary) hypertension: Secondary | ICD-10-CM | POA: Diagnosis not present

## 2019-12-29 DIAGNOSIS — K589 Irritable bowel syndrome without diarrhea: Secondary | ICD-10-CM | POA: Diagnosis not present

## 2019-12-29 DIAGNOSIS — E782 Mixed hyperlipidemia: Secondary | ICD-10-CM | POA: Diagnosis not present

## 2020-01-04 DIAGNOSIS — C44311 Basal cell carcinoma of skin of nose: Secondary | ICD-10-CM | POA: Diagnosis not present

## 2020-02-17 ENCOUNTER — Other Ambulatory Visit: Payer: Self-pay | Admitting: Certified Nurse Midwife

## 2020-02-17 ENCOUNTER — Other Ambulatory Visit: Payer: Self-pay | Admitting: Internal Medicine

## 2020-02-17 DIAGNOSIS — Z1231 Encounter for screening mammogram for malignant neoplasm of breast: Secondary | ICD-10-CM

## 2020-02-24 DIAGNOSIS — Z124 Encounter for screening for malignant neoplasm of cervix: Secondary | ICD-10-CM | POA: Diagnosis not present

## 2020-03-01 DIAGNOSIS — R0789 Other chest pain: Secondary | ICD-10-CM | POA: Diagnosis not present

## 2020-03-01 DIAGNOSIS — I10 Essential (primary) hypertension: Secondary | ICD-10-CM | POA: Diagnosis not present

## 2020-03-01 DIAGNOSIS — E782 Mixed hyperlipidemia: Secondary | ICD-10-CM | POA: Diagnosis not present

## 2020-03-23 ENCOUNTER — Ambulatory Visit
Admission: RE | Admit: 2020-03-23 | Discharge: 2020-03-23 | Disposition: A | Discharge status: Home / Self Care | Payer: PPO | Source: Ambulatory Visit | Attending: Internal Medicine | Admitting: Internal Medicine

## 2020-03-23 ENCOUNTER — Other Ambulatory Visit: Payer: Self-pay

## 2020-03-23 DIAGNOSIS — Z1231 Encounter for screening mammogram for malignant neoplasm of breast: Secondary | ICD-10-CM | POA: Insufficient documentation

## 2020-03-31 ENCOUNTER — Other Ambulatory Visit: Payer: Self-pay | Admitting: Internal Medicine

## 2020-03-31 DIAGNOSIS — R921 Mammographic calcification found on diagnostic imaging of breast: Secondary | ICD-10-CM

## 2020-03-31 DIAGNOSIS — R928 Other abnormal and inconclusive findings on diagnostic imaging of breast: Secondary | ICD-10-CM

## 2020-04-04 ENCOUNTER — Other Ambulatory Visit: Payer: Self-pay

## 2020-04-04 ENCOUNTER — Ambulatory Visit
Admission: RE | Admit: 2020-04-04 | Discharge: 2020-04-04 | Disposition: A | Discharge status: Home / Self Care | Payer: PPO | Source: Ambulatory Visit | Attending: Internal Medicine | Admitting: Internal Medicine

## 2020-04-04 DIAGNOSIS — R928 Other abnormal and inconclusive findings on diagnostic imaging of breast: Secondary | ICD-10-CM | POA: Diagnosis not present

## 2020-04-04 DIAGNOSIS — R921 Mammographic calcification found on diagnostic imaging of breast: Secondary | ICD-10-CM | POA: Diagnosis not present

## 2020-04-06 ENCOUNTER — Other Ambulatory Visit: Payer: Self-pay | Admitting: Internal Medicine

## 2020-04-06 DIAGNOSIS — R928 Other abnormal and inconclusive findings on diagnostic imaging of breast: Secondary | ICD-10-CM

## 2020-04-06 DIAGNOSIS — R921 Mammographic calcification found on diagnostic imaging of breast: Secondary | ICD-10-CM

## 2020-04-06 DIAGNOSIS — N6489 Other specified disorders of breast: Secondary | ICD-10-CM

## 2020-04-12 DIAGNOSIS — H353132 Nonexudative age-related macular degeneration, bilateral, intermediate dry stage: Secondary | ICD-10-CM | POA: Diagnosis not present

## 2020-04-12 DIAGNOSIS — C50911 Malignant neoplasm of unspecified site of right female breast: Secondary | ICD-10-CM

## 2020-04-12 HISTORY — DX: Malignant neoplasm of unspecified site of right female breast: C50.911

## 2020-04-13 ENCOUNTER — Other Ambulatory Visit: Payer: Self-pay

## 2020-04-13 ENCOUNTER — Ambulatory Visit
Admission: RE | Admit: 2020-04-13 | Discharge: 2020-04-13 | Disposition: A | Discharge status: Home / Self Care | Payer: PPO | Source: Ambulatory Visit | Attending: Internal Medicine | Admitting: Internal Medicine

## 2020-04-13 DIAGNOSIS — R921 Mammographic calcification found on diagnostic imaging of breast: Secondary | ICD-10-CM | POA: Diagnosis not present

## 2020-04-13 DIAGNOSIS — N6489 Other specified disorders of breast: Secondary | ICD-10-CM

## 2020-04-13 DIAGNOSIS — R928 Other abnormal and inconclusive findings on diagnostic imaging of breast: Secondary | ICD-10-CM

## 2020-04-13 DIAGNOSIS — C50511 Malignant neoplasm of lower-outer quadrant of right female breast: Secondary | ICD-10-CM | POA: Diagnosis not present

## 2020-04-13 DIAGNOSIS — Z7689 Persons encountering health services in other specified circumstances: Secondary | ICD-10-CM | POA: Diagnosis not present

## 2020-04-13 HISTORY — PX: BREAST BIOPSY: SHX20

## 2020-04-14 DIAGNOSIS — C50919 Malignant neoplasm of unspecified site of unspecified female breast: Secondary | ICD-10-CM

## 2020-04-18 DIAGNOSIS — C50511 Malignant neoplasm of lower-outer quadrant of right female breast: Secondary | ICD-10-CM | POA: Insufficient documentation

## 2020-04-18 NOTE — Progress Notes (Signed)
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  INTERVAL HISTORY: Patient is a 80 year old female with a distant history of right breast cancer who recently underwent screening mammogram followed by ultrasound biopsy revealing the above-stated malignancy.  She currently feels well and is asymptomatic.  She has no neurologic complaints.  She denies any recent fevers or illnesses.  She has a good appetite and denies weight loss.  She has no chest pain, shortness of breath, cough, or hemoptysis.  She denies any nausea, vomiting, constipation, or diarrhea.  She has no urinary complaints.  Patient feels at her baseline offers no specific complaints today.  REVIEW OF SYSTEMS:   Review of Systems  Constitutional: Negative.  Negative for fever, malaise/fatigue and weight loss.  Respiratory: Negative.  Negative for cough, hemoptysis and shortness of breath.   Cardiovascular: Negative.  Negative for chest pain and leg swelling.  Gastrointestinal: Negative.  Negative for abdominal pain.  Genitourinary: Negative.  Negative for dysuria.  Musculoskeletal: Negative.  Negative for back pain.  Skin: Negative.  Negative for rash.  Neurological: Negative.  Negative for dizziness, focal weakness, weakness and headaches.  Psychiatric/Behavioral: Negative.  The patient is not nervous/anxious.     As per HPI. Otherwise, a complete review of systems is negative.  PAST MEDICAL HISTORY: Past Medical History:  Diagnosis Date  . Arthritis   . Basal cell carcinoma 12/08/2019   L nasal dorsum  . Benign essential hypertension   . Breast cancer ([place]) 2003   Rt  . Cancer ([place]) 2003   Rt. breast- radiation   . Dysrhythmia    bradycardia  . Family history of adverse reaction to anesthesia    brother - PONV  . GERD (gastroesophageal reflux disease)   . History of hiatal hernia   . Hyperlipidemia   . IBS (irritable bowel syndrome)   . Personal history of radiation therapy   . PONV (postoperative nausea and vomiting)   . Precordial pain   . Vertigo    (x1) over 10 yrs ago    PAST SURGICAL HISTORY: Past Surgical History:  Procedure Laterality Date  . ABDOMINAL HYSTERECTOMY     age 40  . BREAST BIOPSY Right 2003   +  . BREAST BIOPSY Right 04/13/2020   calcs, Affirm Bx-"X" clip path pending  . BREAST LUMPECTOMY Right    f/u radiation   . CATARACT EXTRACTION W/PHACO Right 08/06/2018   Procedure: CATARACT EXTRACTION PHACO AND INTRAOCULAR LENS PLACEMENT ([place])  RIGHT;  Surgeon: Leandrew Koyanagi, MD;  Location: [facility];  Service: Ophthalmology;  Laterality: Right;  . CATARACT EXTRACTION W/PHACO Left 08/27/2018   Procedure: CATARACT EXTRACTION PHACO AND INTRAOCULAR LENS PLACEMENT ([place])  LEFT;  Surgeon: Leandrew Koyanagi, MD;  Location: [facility];  Service: Ophthalmology;  Laterality: Left;  . COLONOSCOPY WITH PROPOFOL N/A 04/14/2018   Procedure: COLONOSCOPY WITH PROPOFOL;  Surgeon: Lollie Sails, MD;  Location: [facility] ENDOSCOPY;  Service: Endoscopy;  Laterality: N/A;  . ESOPHAGOGASTRODUODENOSCOPY (EGD) WITH PROPOFOL N/A 04/14/2018   Procedure: ESOPHAGOGASTRODUODENOSCOPY (EGD) WITH PROPOFOL;  Surgeon: Lollie Sails, MD;  Location: [facility] ENDOSCOPY;  Service: Endoscopy;  Laterality: N/A;    FAMILY HISTORY: Family History  Problem Relation Age of Onset  . Breast cancer  Neg Hx     ADVANCED DIRECTIVES (Y/N):  N  HEALTH MAINTENANCE: Social History   Tobacco Use  . Smoking status: Never Smoker  . Smokeless tobacco: Never Used  Vaping Use  . Vaping Use: Never used  Substance Use Topics  . Alcohol use: Not Currently     Colonoscopy:  PAP:  Bone  density:  Lipid panel:  Allergies  Allergen Reactions  . Azithromycin Nausea Only  . Cefuroxime Axetil Nausea And Vomiting  . Doxycycline Nausea Only  . Levaquin [Levofloxacin] Nausea Only  . Other Hives    Pneumovac   . Penicillin V Potassium Swelling    Childhood reaction - arm swelling  . Sulfa Antibiotics Nausea And Vomiting  . Naproxen Rash    Current Outpatient Medications  Medication Sig Dispense Refill  . amLODipine (NORVASC) 5 MG tablet Take 10 mg by mouth daily.    Marland Kitchen aspirin 81 MG chewable tablet Chew by mouth daily.    Marland Kitchen CALCIUM CARB-CHOLECALCIFEROL PO Take by mouth daily.    . cetirizine (ZYRTEC) 10 MG tablet Take 10 mg by mouth daily.    . Cholecalciferol 25 MCG (1000 UT) TBDP Take by mouth daily.    . fluocinonide (LIDEX) 0.05 % external solution Apply to affected areas of scalp at bedtime as needed. 60 mL 3  . fluticasone (FLONASE) 50 MCG/ACT nasal spray Place into both nostrils daily.    . hydrochlorothiazide (HYDRODIURIL) 12.5 MG tablet Take 12.5 mg by mouth daily.    Marland Kitchen losartan (COZAAR) 50 MG tablet Take 50 mg by mouth daily.    . Multiple Vitamins-Minerals (PRESERVISION/LUTEIN PO) Take by mouth daily.    . multivitamin-iron-minerals-folic acid (CENTRUM) chewable tablet Chew 1 tablet by mouth daily.    . pantoprazole (PROTONIX) 40 MG tablet Take 40 mg by mouth daily.    . simvastatin (ZOCOR) 20 MG tablet Take 20 mg by mouth daily.    . sodium chloride (OCEAN) 0.65 % nasal spray Place into the nose.     No current facility-administered medications for this visit.    OBJECTIVE: Vitals:   04/20/20 1102  BP: (!) 140/52  Pulse: 77  Temp: 98.8 F (37.1 C)  SpO2: 95%     Body mass index is 27.16 kg/m.    ECOG FS:0 - Asymptomatic  General: Well-developed, well-nourished, no acute distress. Eyes: Pink conjunctiva, anicteric sclera. HEENT: Normocephalic, moist mucous membranes. Breast: Exam deferred today. Lungs: No audible wheezing or coughing. Heart:  Regular rate and rhythm. Abdomen: Soft, nontender, no obvious distention. Musculoskeletal: No edema, cyanosis, or clubbing. Neuro: Alert, answering all questions appropriately. Cranial nerves grossly intact. Skin: No rashes or petechiae noted. Psych: Normal affect. Lymphatics: No cervical, calvicular, axillary or inguinal LAD.   LAB RESULTS:  Lab Results  Component Value Date   NA 143 03/25/2009   K 3.9 03/25/2009   CL 102 03/25/2009   CO2 26 03/25/2009   GLUCOSE 106 (H) 03/25/2009   BUN 18 03/25/2009   CREATININE 1.18 03/25/2009   CALCIUM 9.3 03/25/2009   PROT 6.6 03/25/2009   ALBUMIN 4.4 03/25/2009   AST 17 03/25/2009   ALT 15 03/25/2009   ALKPHOS 48 03/25/2009   BILITOT 0.7 03/25/2009    Lab Results  Component Value Date   WBC 5.8 03/25/2009   NEUTROABS 3.5 03/25/2009   HGB 14.1 03/25/2009   HCT 41.1 03/25/2009   MCV 91.9 03/25/2009   PLT 194 03/25/2009     STUDIES: MM Digital Diagnostic Unilat R  Result  Date: 04/04/2020 CLINICAL DATA:  80 year old female recalled from screening mammogram dated 03/23/2020 for right breast calcifications. Patient has a remote history of treated right breast cancer. EXAM: DIGITAL DIAGNOSTIC RIGHT MAMMOGRAM WITH CAD COMPARISON:  Previous exam(s). ACR Breast Density Category b: There are scattered areas of fibroglandular density. FINDINGS: There is a 3-4 mm group of indeterminate, pleomorphic calcifications with an associated focal asymmetry in the lower outer quadrant of the right breast. Mammographic images were processed with CAD. IMPRESSION: Indeterminate right breast calcifications with possible associated focal asymmetry. Recommendation is for stereotactic biopsy. RECOMMENDATION: Stereotactic biopsy of the right breast. I have discussed the findings and recommendations with the patient. If applicable, a reminder letter will be sent to the patient regarding the next appointment. BI-RADS CATEGORY  4: Suspicious. Electronically Signed    By: Kristopher Oppenheim M.D.   On: 04/04/2020 14:56   MM 3D SCREEN BREAST BILATERAL  Result Date: 03/23/2020 CLINICAL DATA:  Screening. EXAM: DIGITAL SCREENING BILATERAL MAMMOGRAM WITH TOMO AND CAD COMPARISON:  Prior films ACR Breast Density Category b: There are scattered areas of fibroglandular density. FINDINGS: In the right breast, calcifications warrant further evaluation with magnified views. In the left breast, no findings suspicious for malignancy. Images were processed with CAD. IMPRESSION: Further evaluation is suggested for calcifications in the right breast. RECOMMENDATION: Diagnostic mammogram of the right breast. (Code:[id number]-80M) The patient will be contacted regarding the findings, and additional imaging will be scheduled. BI-RADS CATEGORY  0: Incomplete. Need additional imaging evaluation and/or prior mammograms for comparison. Electronically Signed   By: Abelardo Diesel M.D.   On: 03/23/2020 14:10   MM CLIP PLACEMENT RIGHT  Result Date: 04/13/2020 CLINICAL DATA:  Status post stereotactic biopsy right breast calcifications. EXAM: 3D DIAGNOSTIC RIGHT MAMMOGRAM POST STEREOTACTIC BIOPSY COMPARISON:  Previous exam(s). FINDINGS: 3D Mammographic images were obtained following stereotactic guided biopsy of calcifications in the lower outer quadrant of the right breast. The biopsy marking clip is located approximately 1.3 cm posterolateral to the biopsied calcifications. IMPRESSION: The X shaped biopsy marker clip is located approximately 1.3 cm posterior and lateral to the biopsied calcifications. Final Assessment: Post Procedure Mammograms for Marker Placement Electronically Signed   By: Lillia Mountain M.D.   On: 04/13/2020 10:17   MM RT BREAST BX W LOC DEV 1ST LESION IMAGE BX SPEC STEREO GUIDE  Addendum Date: 04/15/2020   ADDENDUM REPORT: 04/15/2020 14:23 ADDENDUM: PATHOLOGY revealed: A. 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 - CALCIFICATIONS ASSOCIATED WITH DCIS. Size of invasive carcinoma: 3 mm in this sample. Grade 1. Ductal carcinoma in situ: Present, intermediate grade, without comedonecrosis Lymphovascular invasion: Not identified. Pathology results are CONCORDANT with imaging findings, per Dr. Lillia Mountain. Pathology results and recommendations below were discussed with patient by telephone on 04/14/2020. Patient reported biopsy site within normal limits with slight tenderness at the site. Post biopsy care instructions were reviewed, questions were answered and my direct phone number was provided to patient. Patient was instructed to call [facility] if any concerns or questions arise related to the biopsy. Recommend surgical consultation: Request for surgical consultation relayed to Al Pimple RN and Tanya Nones RN at [facility] by Electa Sniff RN on 04/14/2020. Pathology results reported by Electa Sniff RN on 04/15/2020. Electronically Signed   By: Lillia Mountain M.D.   On: 04/15/2020 14:23   Result Date: 04/15/2020 CLINICAL DATA:  History of remote treated  right breast cancer. Indeterminate calcifications and asymmetry in the lower outer quadrant of the right breast. EXAM: RIGHT BREAST STEREOTACTIC CORE NEEDLE BIOPSY COMPARISON:  Previous exams. FINDINGS: The patient and I discussed the procedure of stereotactic-guided biopsy including benefits and alternatives. We discussed the high likelihood of a successful procedure. We discussed the risks of the procedure including infection, bleeding, tissue injury, clip migration, and inadequate sampling. Informed written consent was given. The usual time out protocol was performed immediately prior to the procedure. Using sterile technique and 1% lidocaine and 1% lidocaine with epinephrine as local anesthetic, under stereotactic guidance, a 9 gauge vacuum assisted device was used to perform core needle biopsy of  calcifications in the lower outer quadrant of the right breast using a lateral to medial approach. Specimen radiograph was performed showing calcifications are present in the tissue samples. Specimens with calcifications are identified for pathology. Lesion quadrant: Lower outer quadrant At the conclusion of the procedure, X shaped tissue marker clip was deployed into the biopsy cavity. Follow-up 2-view mammogram was performed and dictated separately. IMPRESSION: Stereotactic-guided biopsy of the right breast. No apparent complications. Electronically Signed: By: Lillia Mountain M.D. 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  PLAN:  1. 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  This was a stage Ia (T1c, N0, M0) grade 1 ER/PR positive, HER-2 negative malignancy.  Patient underwent lumpectomy with adjuvant XRT at that time.  She also took 5 years of tamoxifen.  This is unlikely a recurrence but rather a second primary.  Have recommended surgical resection as initial treatment.  Unclear if she can receive radiation again to the same breast and will discuss with radiation oncology.  If XRT is not an option, she may require mastectomy.  Will also await ER/PR and HER-2 results to determine whether chemotherapy or Oncotype DX score is necessary.  Given this is her second breast cancer malignancy, will refer to genetics.  Return to clinic 2 weeks after her surgery for further evaluation and additional treatment planning.  I spent a total of 60 minutes reviewing chart data, face-to-face evaluation with the patient, counseling and coordination of care as detailed above.  Patient expressed understanding and was in agreement with this plan. She also understands that She can call clinic at any time with  any questions, concerns, or complaints.   Cancer Staging Primary cancer of lower-outer quadrant of right breast [facility]) Staging form: Breast, AJCC 8th Edition - Clinical stage from 04/18/2020: cT1a, cN0, cM0, G1 - Signed by Lloyd Huger, MD on 04/18/2020   Lloyd Huger, MD   04/21/2020 11:20 AM

## 2020-04-18 NOTE — Progress Notes (Signed)
Initiated navigation.  Scheduled Med/Onc consult with Dr. Grayland Ormond per patient request.  She would like to discuss surgical referral with her primary provider.  Message left at Dr. Tillman Sers office.  Patient has a history of right breast invasive mammary carcinoma from 2003, with lumpectomy, radiation.

## 2020-04-20 ENCOUNTER — Encounter (INDEPENDENT_AMBULATORY_CARE_PROVIDER_SITE_OTHER): Payer: Self-pay

## 2020-04-20 ENCOUNTER — Inpatient Hospital Stay: Payer: PPO

## 2020-04-20 ENCOUNTER — Encounter: Payer: Self-pay | Admitting: Oncology

## 2020-04-20 ENCOUNTER — Inpatient Hospital Stay: Payer: PPO | Attending: Oncology | Admitting: Oncology

## 2020-04-20 VITALS — BP 140/52 | HR 77 | Temp 98.8°F | Wt 173.4 lb

## 2020-04-20 DIAGNOSIS — C50511 Malignant neoplasm of lower-outer quadrant of right female breast: Secondary | ICD-10-CM | POA: Diagnosis not present

## 2020-04-20 DIAGNOSIS — Z170 Estrogen receptor positive status [ER+]: Secondary | ICD-10-CM | POA: Insufficient documentation

## 2020-04-20 DIAGNOSIS — Z923 Personal history of irradiation: Secondary | ICD-10-CM | POA: Diagnosis not present

## 2020-04-20 DIAGNOSIS — Z853 Personal history of malignant neoplasm of breast: Secondary | ICD-10-CM

## 2020-04-20 NOTE — Progress Notes (Signed)
Patient here today for initial evaluation regarding breast cancer. Patient reports she had breast cancer in 2003, had lumpectomy with radiation and 5 years of AI at that time.

## 2020-04-21 DIAGNOSIS — C50511 Malignant neoplasm of lower-outer quadrant of right female breast: Secondary | ICD-10-CM | POA: Diagnosis not present

## 2020-04-21 NOTE — Progress Notes (Unsigned)
Patient ID: Cheryl Morris, female   DOB: 11/01/1940, 80 y.o.   MRN: [id number] Supported patient and her friend [name] at initial Med/Onc visit with Dr. Grayland Ormond.  Scheduled surgical consult with Dr. Windell Moment for 04/21/20.  Treatment plan to be completed pending final pathology.

## 2020-04-25 ENCOUNTER — Ambulatory Visit: Payer: Self-pay | Admitting: General Surgery

## 2020-04-25 ENCOUNTER — Other Ambulatory Visit: Payer: Self-pay | Admitting: General Surgery

## 2020-04-25 ENCOUNTER — Encounter: Payer: Self-pay | Admitting: General Surgery

## 2020-04-25 DIAGNOSIS — C50511 Malignant neoplasm of lower-outer quadrant of right female breast: Secondary | ICD-10-CM

## 2020-04-25 NOTE — H&P (Signed)
PATIENT PROFILE: Cheryl Morris is a 80 y.o. female who presents to the Clinic for consultation at the request of Dr. Edwina Barth for evaluation of right breast cancer.  PCP:  Velna Ochs, MD  HISTORY OF PRESENT ILLNESS: Cheryl Morris reports having her usual screening mammogram.  Screening mammogram showed concerning calcification of the right breast.  Diagnostic mammogram confirm the suspicious calcifications.  Stereotactic core needle biopsy was done confirming invasive mammary carcinoma.  No suspicious lymph node identified clinically or on diagnostic ultrasound.  Patient has history of right breast cancer treated with partial mastectomy, sentinel node biopsy and radiation therapy.    Family history of breast cancer: None Family history of other cancers: nephew with pancreatic cancer Menarche: 74  Menopause: 80 years old Used OCP: none Used estrogen and progesterone therapy: premarin  History of Radiation to the chest: Yes Number of pregnancies: 2 Age of first pregnancy: 80 years old  PROBLEM LIST: Problem List  Date Reviewed: 03/01/2020         Noted   Other chest pain 03/01/2020   Benign essential hypertension 11/10/2014   Hyperlipidemia, mixed 11/10/2014   IBS (irritable bowel syndrome) 06/01/2014      GENERAL REVIEW OF SYSTEMS:   General ROS: negative for - chills, fatigue, fever, weight gain or weight loss Allergy and Immunology ROS: negative for - hives  Hematological and Lymphatic ROS: negative for - bleeding problems or bruising, negative for palpable nodes Endocrine ROS: negative for - heat or cold intolerance, hair changes Respiratory ROS: negative for - cough, shortness of breath or wheezing Cardiovascular ROS: no chest pain or palpitations GI ROS: negative for nausea, vomiting, abdominal pain, diarrhea, positive for constipation Musculoskeletal ROS: Positive for - joint swelling or muscle pain Neurological ROS: negative for - confusion,  syncope Dermatological ROS: negative for pruritus and rash Psychiatric: negative for anxiety, depression, difficulty sleeping and memory loss  MEDICATIONS: Current Outpatient Medications  Medication Sig Dispense Refill  . amLODIPine (NORVASC) 10 MG tablet TAKE 1 TABLET BY MOUTH DAILY 90 tablet 1  . aspirin 81 MG EC tablet Take 81 mg by mouth once daily.    . calcium carbonate-vitamin D3 (OS-CAL 500+D) 500 mg(1,250mg ) -200 unit tablet Take 1 tablet by mouth 2 (two) times daily with meals.    . cholecalciferol (CHOLECALCIFEROL) 1,000 unit tablet Take 1,000 Units by mouth once daily.    . fluocinonide (LIDEX) 0.05 % external solution Apply to affected areas of scalp at bedtime as needed.    . folic acid/multivit-min/lutein (CENTRUM SILVER ORAL) Take 1 tablet by mouth once daily       . guaifenesin/dextromethorphan (MUCINEX DM ORAL) Take 600 mg by mouth 2 (two) times daily       . hydroCHLOROthiazide (HYDRODIURIL) 12.5 MG tablet TAKE 1 TABLET BY MOUTH DAILY 90 tablet 1  . losartan (COZAAR) 50 MG tablet TAKE ONE TABLET EVERY DAY 90 tablet 3  . pantoprazole (PROTONIX) 40 MG DR tablet TAKE 1 TABLET BY MOUTH DAILY 90 tablet 1  . simvastatin (ZOCOR) 20 MG tablet TAKE ONE TABLET EACH NIGHT 90 tablet 1  . sodium chloride (OCEAN) 0.65 % nasal spray Place 1 spray into both nostrils 3 (three) times daily as needed for Congestion 60 mL 1  . VIT C/VITE AC/LUT/COPPER/ZNOX (PRESERVISION LUTEIN ORAL) Take 2 tablets by mouth once daily.     No current facility-administered medications for this visit.    ALLERGIES: Ceftin [cefuroxime axetil], Doxycycline, Levaquin [levofloxacin], Lisinopril, Naproxen, Other, Penicillin v potassium, and Sulfa (  sulfonamide antibiotics)  PAST MEDICAL HISTORY: Past Medical History:  Diagnosis Date  . Allergic state   . Arrhythmia   . Breast cancer (CMS-HCC) 04/2020  . Fibrocystic breast disease   . History of chicken pox   . History of right breast cancer 01/20/2002    lumpectomy with Dr Annamaria Boots at [facility] in [place], then [facility] cancer center for radiation  . Hyperlipidemia   . Hypertension   . Neutropenia (CMS-HCC)     PAST SURGICAL HISTORY: Past Surgical History:  Procedure Laterality Date  . CHOLECYSTECTOMY    . COLONOSCOPY  10/22/06; 12/23/09; 03/09/13  . COLONOSCOPY  04/14/2018   Tubular adenoma of the colon/Repeat 36yrs/MUS  . CYSTECTOMY     Cyst removed from finger  . EGD  04/14/2018   GERD/No Repeat/MUS  . HYSTERECTOMY N/A 08/1974   partial hysterectomy, still has ovaries, no cervix per pt  . MASTECTOMY PARTIAL / LUMPECTOMY Right 01/20/2002  . nasal surgery    . TONSILLECTOMY       FAMILY HISTORY: Family History  Problem Relation Age of Onset  . Alzheimer's disease Mother   . Myocardial Infarction (Heart attack) Father   . No Known Problems Brother   . No Known Problems Brother   . No Known Problems Brother      SOCIAL HISTORY: Social History   Socioeconomic History  . Marital status: Widowed    Spouse name: Not on file  . Number of children: Not on file  . Years of education: Not on file  . Highest education level: Not on file  Occupational History  . Occupation: Retired  Tobacco Use  . Smoking status: Never Smoker  . Smokeless tobacco: Never Used  Vaping Use  . Vaping Use: Never used  Substance and Sexual Activity  . Alcohol use: No    Alcohol/week: 0.0 standard drinks  . Drug use: No  . Sexual activity: Not Currently    Birth control/protection: Surgical  Other Topics Concern  . Would you please tell us about the people who live in your home, your pets, or anything else important to your social life? Not Asked  Social History Narrative  . Not on file   Social Determinants of Health   Financial Resource Strain: Not on file  Food Insecurity: Not on file  Transportation Needs: Not on file    PHYSICAL EXAM: Vitals:   04/21/20 1340  BP: 136/74  Pulse: 72   Body mass index is 27.25 kg/m. Weight:  78.9 kg (174 lb)   GENERAL: Alert, active, oriented x3  HEENT: Pupils equal reactive to light. Extraocular movements are intact. Sclera clear. Palpebral conjunctiva normal red color.Pharynx clear.  NECK: Supple with no palpable mass and no adenopathy.  LUNGS: Sound clear with no rales rhonchi or wheezes.  HEART: Regular rhythm S1 and S2 without murmur.  BREAST: breasts appear normal, no suspicious masses, no skin or nipple changes or axillary nodes.  ABDOMEN: Soft and depressible, nontender with no palpable mass, no hepatomegaly.  EXTREMITIES: Well-developed well-nourished symmetrical with no dependent edema.  NEUROLOGICAL: Awake alert oriented, facial expression symmetrical, moving all extremities.  REVIEW OF DATA: I have reviewed the following data today: Office Visit on 03/01/2020  Component Date Value  . Vent Rate (bpm) 03/01/2020 61   . PR Interval (msec) 03/01/2020 154   . QRS Interval (msec) 03/01/2020 88   . QT Interval (msec) 03/01/2020 422   . QTc (msec) 03/01/2020 424      ASSESSMENT: Cheryl Morris is  a 80 y.o. female presenting for consultation for right breast cancer.    Patient was oriented again about the pathology results. Surgical alternatives were discussed with patient including partial vs total mastectomy. Surgical technique and post operative care was discussed with patient. Risk of surgery was discussed with patient including but not limited to: wound infection, seroma, hematoma, brachial plexopathy, mondor's disease (thrombosis of small veins of breast), chronic wound pain, breast lymphedema, altered sensation to the nipple and cosmesis among others.   Due to patient previous history of radiation therapy to the right breast the recommendation is to proceed with total mastectomy.  This case was discussed with medical oncology and radiation oncology and they agreed that it would be suboptimal to do partial mastectomy with incomplete radiation therapy.  The  patient was oriented about these findings and she discussed with her family about this recommendation and agreed to proceed with total mastectomy.  Malignant neoplasm of lower-outer quadrant of right female breast, unspecified estrogen receptor status (CMS-HCC) [C50.511]  PLAN: 1.  Right total mastectomy with possible sentinel node biopsy (19301, 38525) 2.  If sentinel node biopsy is unable to be done the patient will need a modified radical mastectomy (19471)  Patient verbalized understanding, all questions were answered, and were agreeable with the plan outlined above.     Herbert Pun, MD  Electronically signed by Herbert Pun, MD

## 2020-04-25 NOTE — H&P (View-Only) (Signed)
PATIENT PROFILE: Cheryl Morris is a 80 y.o. female who presents to the Clinic for consultation at the request of Dr. Edwina Barth for evaluation of right breast cancer.  PCP:  Velna Ochs, MD  HISTORY OF PRESENT ILLNESS: Cheryl Morris reports having her usual screening mammogram.  Screening mammogram showed concerning calcification of the right breast.  Diagnostic mammogram confirm the suspicious calcifications.  Stereotactic core needle biopsy was done confirming invasive mammary carcinoma.  No suspicious lymph node identified clinically or on diagnostic ultrasound.  Patient has history of right breast cancer treated with partial mastectomy, sentinel node biopsy and radiation therapy.    Family history of breast cancer: None Family history of other cancers: nephew with pancreatic cancer Menarche: 62  Menopause: 79 years old Used OCP: none Used estrogen and progesterone therapy: premarin  History of Radiation to the chest: Yes Number of pregnancies: 2 Age of first pregnancy: 80 years old  PROBLEM LIST: Problem List  Date Reviewed: 03/01/2020         Noted   Other chest pain 03/01/2020   Benign essential hypertension 11/10/2014   Hyperlipidemia, mixed 11/10/2014   IBS (irritable bowel syndrome) 06/01/2014      GENERAL REVIEW OF SYSTEMS:   General ROS: negative for - chills, fatigue, fever, weight gain or weight loss Allergy and Immunology ROS: negative for - hives  Hematological and Lymphatic ROS: negative for - bleeding problems or bruising, negative for palpable nodes Endocrine ROS: negative for - heat or cold intolerance, hair changes Respiratory ROS: negative for - cough, shortness of breath or wheezing Cardiovascular ROS: no chest pain or palpitations GI ROS: negative for nausea, vomiting, abdominal pain, diarrhea, positive for constipation Musculoskeletal ROS: Positive for - joint swelling or muscle pain Neurological ROS: negative for - confusion,  syncope Dermatological ROS: negative for pruritus and rash Psychiatric: negative for anxiety, depression, difficulty sleeping and memory loss  MEDICATIONS: Current Outpatient Medications  Medication Sig Dispense Refill  . amLODIPine (NORVASC) 10 MG tablet TAKE 1 TABLET BY MOUTH DAILY 90 tablet 1  . aspirin 81 MG EC tablet Take 81 mg by mouth once daily.    . calcium carbonate-vitamin D3 (OS-CAL 500+D) 500 mg(1,250mg ) -200 unit tablet Take 1 tablet by mouth 2 (two) times daily with meals.    . cholecalciferol (CHOLECALCIFEROL) 1,000 unit tablet Take 1,000 Units by mouth once daily.    . fluocinonide (LIDEX) 0.05 % external solution Apply to affected areas of scalp at bedtime as needed.    . folic acid/multivit-min/lutein (CENTRUM SILVER ORAL) Take 1 tablet by mouth once daily       . guaifenesin/dextromethorphan (MUCINEX DM ORAL) Take 600 mg by mouth 2 (two) times daily       . hydroCHLOROthiazide (HYDRODIURIL) 12.5 MG tablet TAKE 1 TABLET BY MOUTH DAILY 90 tablet 1  . losartan (COZAAR) 50 MG tablet TAKE ONE TABLET EVERY DAY 90 tablet 3  . pantoprazole (PROTONIX) 40 MG DR tablet TAKE 1 TABLET BY MOUTH DAILY 90 tablet 1  . simvastatin (ZOCOR) 20 MG tablet TAKE ONE TABLET EACH NIGHT 90 tablet 1  . sodium chloride (OCEAN) 0.65 % nasal spray Place 1 spray into both nostrils 3 (three) times daily as needed for Congestion 60 mL 1  . VIT C/VITE AC/LUT/COPPER/ZNOX (PRESERVISION LUTEIN ORAL) Take 2 tablets by mouth once daily.     No current facility-administered medications for this visit.    ALLERGIES: Ceftin [cefuroxime axetil], Doxycycline, Levaquin [levofloxacin], Lisinopril, Naproxen, Other, Penicillin v potassium, and Sulfa (  sulfonamide antibiotics)  PAST MEDICAL HISTORY: Past Medical History:  Diagnosis Date  . Allergic state   . Arrhythmia   . Breast cancer (CMS-HCC) 04/2020  . Fibrocystic breast disease   . History of chicken pox   . History of right breast cancer 01/20/2002    lumpectomy with Dr Annamaria Boots at [facility] in [place], then [facility] cancer center for radiation  . Hyperlipidemia   . Hypertension   . Neutropenia (CMS-HCC)     PAST SURGICAL HISTORY: Past Surgical History:  Procedure Laterality Date  . CHOLECYSTECTOMY    . COLONOSCOPY  10/22/06; 12/23/09; 03/09/13  . COLONOSCOPY  04/14/2018   Tubular adenoma of the colon/Repeat 59yrs/MUS  . CYSTECTOMY     Cyst removed from finger  . EGD  04/14/2018   GERD/No Repeat/MUS  . HYSTERECTOMY N/A 08/1974   partial hysterectomy, still has ovaries, no cervix per pt  . MASTECTOMY PARTIAL / LUMPECTOMY Right 01/20/2002  . nasal surgery    . TONSILLECTOMY       FAMILY HISTORY: Family History  Problem Relation Age of Onset  . Alzheimer's disease Mother   . Myocardial Infarction (Heart attack) Father   . No Known Problems Brother   . No Known Problems Brother   . No Known Problems Brother      SOCIAL HISTORY: Social History   Socioeconomic History  . Marital status: Widowed    Spouse name: Not on file  . Number of children: Not on file  . Years of education: Not on file  . Highest education level: Not on file  Occupational History  . Occupation: Retired  Tobacco Use  . Smoking status: Never Smoker  . Smokeless tobacco: Never Used  Vaping Use  . Vaping Use: Never used  Substance and Sexual Activity  . Alcohol use: No    Alcohol/week: 0.0 standard drinks  . Drug use: No  . Sexual activity: Not Currently    Birth control/protection: Surgical  Other Topics Concern  . Would you please tell us about the people who live in your home, your pets, or anything else important to your social life? Not Asked  Social History Narrative  . Not on file   Social Determinants of Health   Financial Resource Strain: Not on file  Food Insecurity: Not on file  Transportation Needs: Not on file    PHYSICAL EXAM: Vitals:   04/21/20 1340  BP: 136/74  Pulse: 72   Body mass index is 27.25 kg/m. Weight:  78.9 kg (174 lb)   GENERAL: Alert, active, oriented x3  HEENT: Pupils equal reactive to light. Extraocular movements are intact. Sclera clear. Palpebral conjunctiva normal red color.Pharynx clear.  NECK: Supple with no palpable mass and no adenopathy.  LUNGS: Sound clear with no rales rhonchi or wheezes.  HEART: Regular rhythm S1 and S2 without murmur.  BREAST: breasts appear normal, no suspicious masses, no skin or nipple changes or axillary nodes.  ABDOMEN: Soft and depressible, nontender with no palpable mass, no hepatomegaly.  EXTREMITIES: Well-developed well-nourished symmetrical with no dependent edema.  NEUROLOGICAL: Awake alert oriented, facial expression symmetrical, moving all extremities.  REVIEW OF DATA: I have reviewed the following data today: Office Visit on 03/01/2020  Component Date Value  . Vent Rate (bpm) 03/01/2020 61   . PR Interval (msec) 03/01/2020 154   . QRS Interval (msec) 03/01/2020 88   . QT Interval (msec) 03/01/2020 422   . QTc (msec) 03/01/2020 424      ASSESSMENT: Ms. Mcafee is  a 80 y.o. female presenting for consultation for right breast cancer.    Patient was oriented again about the pathology results. Surgical alternatives were discussed with patient including partial vs total mastectomy. Surgical technique and post operative care was discussed with patient. Risk of surgery was discussed with patient including but not limited to: wound infection, seroma, hematoma, brachial plexopathy, mondor's disease (thrombosis of small veins of breast), chronic wound pain, breast lymphedema, altered sensation to the nipple and cosmesis among others.   Due to patient previous history of radiation therapy to the right breast the recommendation is to proceed with total mastectomy.  This case was discussed with medical oncology and radiation oncology and they agreed that it would be suboptimal to do partial mastectomy with incomplete radiation therapy.  The  patient was oriented about these findings and she discussed with her family about this recommendation and agreed to proceed with total mastectomy.  Malignant neoplasm of lower-outer quadrant of right female breast, unspecified estrogen receptor status (CMS-HCC) [C50.511]  PLAN: 1.  Right total mastectomy with possible sentinel node biopsy (19301, 38525) 2.  If sentinel node biopsy is unable to be done the patient will need a modified radical mastectomy (29562)  Patient verbalized understanding, all questions were answered, and were agreeable with the plan outlined above.     Herbert Pun, MD  Electronically signed by Herbert Pun, MD

## 2020-04-26 DIAGNOSIS — I10 Essential (primary) hypertension: Secondary | ICD-10-CM | POA: Diagnosis not present

## 2020-04-28 ENCOUNTER — Other Ambulatory Visit: Payer: Self-pay

## 2020-04-28 ENCOUNTER — Other Ambulatory Visit
Admission: RE | Admit: 2020-04-28 | Discharge: 2020-04-28 | Disposition: A | Discharge status: Home / Self Care | Payer: PPO | Source: Ambulatory Visit | Attending: General Surgery | Admitting: General Surgery

## 2020-04-28 NOTE — Patient Instructions (Signed)
Your procedure is scheduled on: Wednesday May 04, 2020 Report to Day Surgery inside [facility] 2nd floor stop by Admission desk first then Radiology before getting on Elevator) To find out your arrival time please call [phone] between 1PM - 3PM on Tuesday May 03, 2020.  Remember: Instructions that are not followed completely may result in serious medical risk,  up to and including death, or upon the discretion of your surgeon and anesthesiologist your  surgery may need to be rescheduled.     _X__ 1. Do not eat food after midnight the night before your procedure.                 No chewing gum or hard candies. You may drink clear liquids up to 2 hours                 before you are scheduled to arrive for your surgery- DO not drink clear                 liquids within 2 hours of the start of your surgery.                 Clear Liquids include:  water, apple juice without pulp, clear Gatorade, G2 or                  Gatorade Zero (avoid Red/Purple/Blue), Black Coffee or Tea (Do not add                 anything to coffee or tea).  __X__2.  On the morning of surgery brush your teeth with toothpaste and water, you                may rinse your mouth with mouthwash if you wish.  Do not swallow any toothpaste of mouthwash.     _X__ 3.  No Alcohol for 24 hours before or after surgery.   _X__ 4.  Do Not Smoke or use e-cigarettes For 24 Hours Prior to Your Surgery.                 Do not use any chewable tobacco products for at least 6 hours prior to                 Surgery.  _X__  5.  Do not use any recreational drugs (marijuana, cocaine, heroin, ecstasy, MDMA or other)                For at least one week prior to your surgery.  Combination of these drugs with anesthesia                May have life threatening results.  __X__ 6.  Notify your doctor if there is any change in your medical condition      (cold, fever, infections).     Do not wear  jewelry, make-up, hairpins, clips or nail polish. Do not wear lotions, powders, or perfumes. . Do not shave 48 hours prior to surgery. Men may shave face and neck. Do not bring valuables to the hospital.    [facility] is not responsible for any belongings or valuables.  Contacts, dentures or bridgework may not be worn into surgery. Leave your suitcase in the car. After surgery it may be brought to your room. For patients admitted to the hospital, discharge time is determined by your treatment team.   Patients discharged the day of surgery will not be allowed to drive home.  Make arrangements for someone to be with you for the first 24 hours of your Same Day Discharge.   __X__ Take these medicines the morning of surgery with A SIP OF WATER:    1. amLODipine (NORVASC) 10 MG   2. pantoprazole (PROTONIX) 40 MG   ____ Fleet Enema (as directed)   __X__ Use CHG Soap (or wipes) as directed  ____ Use Benzoyl Peroxide Gel as instructed  ____ Use inhalers on the day of surgery  ____ Stop metformin 2 days prior to surgery    ____ Take 1/2 of usual insulin dose the night before surgery. No insulin the morning          of surgery.  __X_ Stop aspirin 81 MG as instructed by your provider.   __X__ Stop Anti-inflammatories such as Ibuprofen, Aleve, Advil, naproxen and or BC powders.    __X__ Stop supplements until after surgery.    __X__ Do not start any herbal supplements before your procedure.    If you have any questions regarding your pre-procedure instructions,  Please call Pre-admit Testing at [phone].

## 2020-04-28 NOTE — Progress Notes (Signed)
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 Patient has a documented allergy to PCN VK and CEFUROXIME . Advising that PCN VK has caused her to experience arm swelling as a child. . Advising that CEFUROXIME has cause nausea and vomiting in the past.    2. Screened as appropriate for cephalosporin use during medication reconciliation . No immediate angioedema, dysphagia, SOB, anaphylaxis symptoms. . No severe rash involving mucous membranes or skin necrosis. . No hospital admissions related to side effects of PCN/cephalosporin use.  . No documented reaction to PCN or cephalosporin in the last 10 years.  Request:  As an evidence based approach to reducing the rate of incidence for post-operative SSI and the development of MDROs, could an agent with narrower coverage for preoperative prophylaxis in this patient's upcoming surgical course be considered?   1. Currently ordered preoperative prophylactic ABX: clindamycin.   2. Specifically requesting change to cephalosporin (CEFAZOLIN).   3. Please communicate decision with me and I will change the orders in Epic as per your direction.   Things to consider:  Many patients report that they were "allergic" to PCN earlier in life, however this does not translate into a true lifelong allergy. Patients can lose sensitivity  to specific IgE antibodies over time if PCN is avoided (Kleris & Lugar, 2019).   Up to 10% of the adult population and 15% of hospitalized patients report an allergy to PCN, however clinical studies suggest that 90% of those reporting an allergy can tolerate PCN antibiotics (Kleris & Lugar, 2019).   Cross-sensitivity between PCN and cephalosporins has been documented as being as high as 10%, however this estimation included data believed to have been collected in a setting where there was contamination. Newer data suggests that the prevalence of cross-sensitivity between PCN and cephalosporins is actually estimated to be closer to 1% (Hermanides et al., 2018).    Patients labeled as PCN allergic, whether they are truly allergic or not, have been found to have inferior outcomes in terms of rates of serious infection, and these patients tend to have longer hospital stays ([place], 2019).   Treatment related secondary infections, such as Clostridioides difficile, have been linked to the improper use of broad spectrum antibiotics in patients improperly labeled as PCN allergic (Kleris & Lugar, 2019).   Anaphylaxis from cephalosporins is rare and the evidence suggests that there is no increased risk of an anaphylactic type reaction when cephalosporins are used in a PCN allergic patient (Pichichero, 2006).  Citations: Hermanides J, Lemkes BA, Prins Pearla Dubonnet MW, Terreehorst I. Presumed ?-Lactam Allergy and Cross-reactivity in the Operating Theater: A Practical Approach. Anesthesiology. 2018 Aug;129(2):335-342. doi: 10.1097/ALN.0000000000002252. PMID: 85277824.  Kleris, [place]., & Lugar, P. L. (2019). Things We Do For No Reason: Failing to Question a Penicillin Allergy History. Journal of hospital medicine, 14(10), [phone]. Advance online publication. https://www.wallace-middleton.info/  Pichichero, M. E. (2006). Cephalosporins can be prescribed safely  for penicillin-allergic patients. Journal of  family medicine, 55(2), 106-112. Accessed: https://cdn.mdedge.com/files/s104fs-public/Document/September-2017/5502JFP_AppliedEvidence1.pdf   Honor Loh, MSN, APRN, FNP-C, CEN [facility]  Peri-operative Services Nurse Practitioner FAX: [phone] 04/28/20 2:08 PM

## 2020-04-29 DIAGNOSIS — I10 Essential (primary) hypertension: Secondary | ICD-10-CM | POA: Diagnosis not present

## 2020-05-02 ENCOUNTER — Other Ambulatory Visit
Admission: RE | Admit: 2020-05-02 | Discharge: 2020-05-02 | Disposition: A | Discharge status: Home / Self Care | Payer: PPO | Source: Ambulatory Visit | Attending: General Surgery | Admitting: General Surgery

## 2020-05-02 ENCOUNTER — Encounter: Payer: Self-pay | Admitting: General Surgery

## 2020-05-02 ENCOUNTER — Other Ambulatory Visit: Payer: Self-pay

## 2020-05-02 DIAGNOSIS — R0602 Shortness of breath: Secondary | ICD-10-CM | POA: Diagnosis not present

## 2020-05-02 DIAGNOSIS — E782 Mixed hyperlipidemia: Secondary | ICD-10-CM | POA: Diagnosis not present

## 2020-05-02 DIAGNOSIS — I10 Essential (primary) hypertension: Secondary | ICD-10-CM | POA: Diagnosis not present

## 2020-05-02 DIAGNOSIS — Z01812 Encounter for preprocedural laboratory examination: Secondary | ICD-10-CM | POA: Diagnosis not present

## 2020-05-02 DIAGNOSIS — Z20822 Contact with and (suspected) exposure to covid-19: Secondary | ICD-10-CM | POA: Diagnosis not present

## 2020-05-02 NOTE — Progress Notes (Signed)
Patient ID: Cheryl Morris, female   DOB: [birth year], 80 y.o.   MRN: [id number] Patient called 04/29/20 with questions about upcoming appointments conflicting with  Pre-op, and surgery .  Per Dr. Tillman Sers scheduler, patient does not need to keep appointment for 05/03/20.  She is having COVID test, and cardiac clearance appointment today.  Left message for patient to reschedule appointment with  Dr. Edwina Barth.

## 2020-05-03 ENCOUNTER — Encounter: Payer: Self-pay | Admitting: General Surgery

## 2020-05-03 ENCOUNTER — Encounter: Payer: Self-pay | Admitting: Oncology

## 2020-05-03 LAB — SARS CORONAVIRUS 2 (TAT 6-24 HRS): SARS Coronavirus 2: NEGATIVE

## 2020-05-03 LAB — SURGICAL PATHOLOGY

## 2020-05-04 ENCOUNTER — Observation Stay
Admission: RE | Admit: 2020-05-04 | Discharge: 2020-05-05 | Disposition: A | Discharge status: Home / Self Care | Payer: PPO | Attending: General Surgery | Admitting: General Surgery

## 2020-05-04 ENCOUNTER — Ambulatory Visit
Admission: RE | Admit: 2020-05-04 | Discharge: 2020-05-04 | Disposition: A | Discharge status: Home / Self Care | Payer: PPO | Source: Ambulatory Visit | Attending: General Surgery | Admitting: General Surgery

## 2020-05-04 ENCOUNTER — Encounter: Payer: Self-pay | Admitting: General Surgery

## 2020-05-04 ENCOUNTER — Other Ambulatory Visit: Payer: Self-pay

## 2020-05-04 ENCOUNTER — Ambulatory Visit: Payer: PPO | Admitting: Urgent Care

## 2020-05-04 ENCOUNTER — Encounter
Admission: RE | Disposition: A | Discharge status: Home / Self Care | Payer: Self-pay | Source: Home / Self Care | Attending: General Surgery

## 2020-05-04 DIAGNOSIS — Z79899 Other long term (current) drug therapy: Secondary | ICD-10-CM | POA: Insufficient documentation

## 2020-05-04 DIAGNOSIS — Z170 Estrogen receptor positive status [ER+]: Secondary | ICD-10-CM | POA: Diagnosis not present

## 2020-05-04 DIAGNOSIS — C50511 Malignant neoplasm of lower-outer quadrant of right female breast: Secondary | ICD-10-CM

## 2020-05-04 DIAGNOSIS — Z7982 Long term (current) use of aspirin: Secondary | ICD-10-CM | POA: Diagnosis not present

## 2020-05-04 DIAGNOSIS — C50919 Malignant neoplasm of unspecified site of unspecified female breast: Secondary | ICD-10-CM | POA: Diagnosis present

## 2020-05-04 DIAGNOSIS — I10 Essential (primary) hypertension: Secondary | ICD-10-CM | POA: Insufficient documentation

## 2020-05-04 HISTORY — DX: Essential (primary) hypertension: I10

## 2020-05-04 HISTORY — PX: PARTIAL MASTECTOMY WITH AXILLARY SENTINEL LYMPH NODE BIOPSY: SHX6004

## 2020-05-04 HISTORY — DX: Bradycardia, unspecified: R00.1

## 2020-05-04 HISTORY — DX: Diffuse cystic mastopathy of unspecified breast: N60.19

## 2020-05-04 HISTORY — PX: MASTECTOMY: SHX3

## 2020-05-04 SURGERY — PARTIAL MASTECTOMY WITH AXILLARY SENTINEL LYMPH NODE BIOPSY
Anesthesia: General | Laterality: Right

## 2020-05-04 MED ORDER — PROPOFOL 10 MG/ML IV BOLUS
INTRAVENOUS | Status: DC | PRN
  Administered 2020-05-04: 100 mg via INTRAVENOUS

## 2020-05-04 MED ORDER — LIDOCAINE HCL (CARDIAC) PF 100 MG/5ML IV SOSY
PREFILLED_SYRINGE | INTRAVENOUS | Status: DC | PRN
  Administered 2020-05-04: 100 mg via INTRAVENOUS

## 2020-05-04 MED ORDER — SODIUM CHLORIDE (PF) 0.9 % IJ SOLN
INTRAMUSCULAR | Status: AC
  Filled 2020-05-04: qty 50

## 2020-05-04 MED ORDER — LACTATED RINGERS IV SOLN: INTRAVENOUS | Status: DC

## 2020-05-04 MED ORDER — HYDROCHLOROTHIAZIDE 25 MG PO TABS: 12.5000 mg | ORAL_TABLET | Freq: Every day | ORAL | Status: DC

## 2020-05-04 MED ORDER — CLINDAMYCIN PHOSPHATE 900 MG/50ML IV SOLN
INTRAVENOUS | Status: AC
  Filled 2020-05-04: qty 50

## 2020-05-04 MED ORDER — OXYCODONE HCL 5 MG/5ML PO SOLN: 5.0000 mg | Freq: Once | ORAL | Status: DC | PRN

## 2020-05-04 MED ORDER — ASPIRIN 81 MG PO CHEW: 81.0000 mg | CHEWABLE_TABLET | Freq: Every day | ORAL | Status: DC

## 2020-05-04 MED ORDER — BUPIVACAINE LIPOSOME 1.3 % IJ SUSP
INTRAMUSCULAR | Status: DC | PRN
  Administered 2020-05-04: 20 mL

## 2020-05-04 MED ORDER — GUAIFENESIN ER 600 MG PO TB12: 600.0000 mg | ORAL_TABLET | Freq: Two times a day (BID) | ORAL | Status: DC | PRN

## 2020-05-04 MED ORDER — OXYCODONE HCL 5 MG PO TABS: 5.0000 mg | ORAL_TABLET | Freq: Once | ORAL | Status: DC | PRN

## 2020-05-04 MED ORDER — BUPIVACAINE-EPINEPHRINE 0.5% -1:200000 IJ SOLN
INTRAMUSCULAR | Status: DC | PRN
Start: 2020-05-04 — End: 2020-05-04
  Administered 2020-05-04: 30 mL

## 2020-05-04 MED ORDER — MORPHINE SULFATE (PF) 2 MG/ML IV SOLN: 2.0000 mg | INTRAVENOUS | Status: DC | PRN

## 2020-05-04 MED ORDER — ONDANSETRON 4 MG PO TBDP: 4.0000 mg | ORAL_TABLET | Freq: Four times a day (QID) | ORAL | Status: DC | PRN

## 2020-05-04 MED ORDER — TECHNETIUM TC 99M TILMANOCEPT KIT
1.0000 | PACK | Freq: Once | INTRAVENOUS | Status: AC | PRN
  Administered 2020-05-04: 1 via INTRADERMAL

## 2020-05-04 MED ORDER — ORAL CARE MOUTH RINSE: 15.0000 mL | Freq: Once | OROMUCOSAL | Status: AC

## 2020-05-04 MED ORDER — ONDANSETRON HCL 4 MG/2ML IJ SOLN: 4.0000 mg | Freq: Once | INTRAMUSCULAR | Status: DC | PRN

## 2020-05-04 MED ORDER — FENTANYL CITRATE (PF) 100 MCG/2ML IJ SOLN
INTRAMUSCULAR | Status: AC
  Filled 2020-05-04: qty 2

## 2020-05-04 MED ORDER — ENOXAPARIN SODIUM 40 MG/0.4ML ~~LOC~~ SOLN: 40.0000 mg | SUBCUTANEOUS | Status: DC

## 2020-05-04 MED ORDER — VITAMIN D 25 MCG (1000 UNIT) PO TABS: 1000.0000 [IU] | ORAL_TABLET | Freq: Every day | ORAL | Status: DC

## 2020-05-04 MED ORDER — METHYLENE BLUE 0.5 % INJ SOLN
INTRAVENOUS | Status: DC | PRN
  Administered 2020-05-04: 2 mL via SUBMUCOSAL

## 2020-05-04 MED ORDER — ACETAMINOPHEN 10 MG/ML IV SOLN
INTRAVENOUS | Status: AC
  Filled 2020-05-04: qty 100

## 2020-05-04 MED ORDER — CHLORHEXIDINE GLUCONATE 0.12 % MT SOLN
OROMUCOSAL | Status: AC
  Administered 2020-05-04: 15 mL via OROMUCOSAL
  Filled 2020-05-04: qty 15

## 2020-05-04 MED ORDER — ONDANSETRON HCL 4 MG/2ML IJ SOLN: 4.0000 mg | Freq: Four times a day (QID) | INTRAMUSCULAR | Status: DC | PRN

## 2020-05-04 MED ORDER — ONDANSETRON HCL 4 MG/2ML IJ SOLN
INTRAMUSCULAR | Status: DC | PRN
  Administered 2020-05-04: 4 mg via INTRAVENOUS

## 2020-05-04 MED ORDER — CALCIUM CARBONATE-VITAMIN D 500-200 MG-UNIT PO TABS: 1.0000 | ORAL_TABLET | Freq: Every day | ORAL | Status: DC

## 2020-05-04 MED ORDER — BUPIVACAINE LIPOSOME 1.3 % IJ SUSP
INTRAMUSCULAR | Status: AC
  Filled 2020-05-04: qty 20

## 2020-05-04 MED ORDER — FENTANYL CITRATE (PF) 100 MCG/2ML IJ SOLN
INTRAMUSCULAR | Status: DC | PRN
  Administered 2020-05-04 (×2): 50 ug via INTRAVENOUS

## 2020-05-04 MED ORDER — HYDROCODONE-ACETAMINOPHEN 5-325 MG PO TABS
1.0000 | ORAL_TABLET | ORAL | Status: DC | PRN
  Administered 2020-05-05: 1 via ORAL
  Filled 2020-05-04: qty 1

## 2020-05-04 MED ORDER — POLYVINYL ALCOHOL 1.4 % OP SOLN
1.0000 [drp] | Freq: Three times a day (TID) | OPHTHALMIC | Status: DC | PRN
  Filled 2020-05-04: qty 15

## 2020-05-04 MED ORDER — PANTOPRAZOLE SODIUM 40 MG PO TBEC: 40.0000 mg | DELAYED_RELEASE_TABLET | Freq: Every day | ORAL | Status: DC

## 2020-05-04 MED ORDER — AMLODIPINE BESYLATE 10 MG PO TABS: 10.0000 mg | ORAL_TABLET | Freq: Every day | ORAL | Status: DC

## 2020-05-04 MED ORDER — FENTANYL CITRATE (PF) 100 MCG/2ML IJ SOLN: 25.0000 ug | INTRAMUSCULAR | Status: DC | PRN

## 2020-05-04 MED ORDER — CLINDAMYCIN PHOSPHATE 900 MG/50ML IV SOLN
900.0000 mg | INTRAVENOUS | Status: AC
  Administered 2020-05-04: 900 mg via INTRAVENOUS

## 2020-05-04 MED ORDER — PROPOFOL 10 MG/ML IV BOLUS
INTRAVENOUS | Status: AC
  Filled 2020-05-04: qty 20

## 2020-05-04 MED ORDER — CALCIUM CARB-CHOLECALCIFEROL 500-125 MG-UNIT PO TABS: ORAL_TABLET | Freq: Every day | ORAL | Status: DC

## 2020-05-04 MED ORDER — BUPIVACAINE-EPINEPHRINE (PF) 0.5% -1:200000 IJ SOLN
INTRAMUSCULAR | Status: AC
  Filled 2020-05-04: qty 90

## 2020-05-04 MED ORDER — CHLORHEXIDINE GLUCONATE 0.12 % MT SOLN: 15.0000 mL | Freq: Once | OROMUCOSAL | Status: AC

## 2020-05-04 MED ORDER — FLUOCINONIDE 0.05 % EX SOLN: 1.0000 "application " | Freq: Every evening | CUTANEOUS | Status: DC | PRN

## 2020-05-04 MED ORDER — DEXAMETHASONE SODIUM PHOSPHATE 10 MG/ML IJ SOLN
INTRAMUSCULAR | Status: DC | PRN
  Administered 2020-05-04: 10 mg via INTRAVENOUS

## 2020-05-04 MED ORDER — ACETAMINOPHEN 10 MG/ML IV SOLN
INTRAVENOUS | Status: DC | PRN
  Administered 2020-05-04: 1000 mg via INTRAVENOUS

## 2020-05-04 MED ORDER — LOSARTAN POTASSIUM 50 MG PO TABS: 50.0000 mg | ORAL_TABLET | Freq: Every day | ORAL | Status: DC

## 2020-05-04 MED ORDER — MECLIZINE HCL 25 MG PO TABS
25.0000 mg | ORAL_TABLET | Freq: Three times a day (TID) | ORAL | Status: DC | PRN
  Filled 2020-05-04: qty 1

## 2020-05-04 SURGICAL SUPPLY — 42 items
ADH SKN CLS APL DERMABOND .7 (GAUZE/BANDAGES/DRESSINGS) ×1
APL PRP STRL LF DISP 70% ISPRP (MISCELLANEOUS) ×1
BINDER BREAST LRG (GAUZE/BANDAGES/DRESSINGS) ×1 IMPLANT
BINDER BREAST MEDIUM (GAUZE/BANDAGES/DRESSINGS) IMPLANT
BINDER BREAST XLRG (GAUZE/BANDAGES/DRESSINGS) IMPLANT
BULB RESERV EVAC DRAIN JP 100C (MISCELLANEOUS) ×3 IMPLANT
CHLORAPREP W/TINT 26 (MISCELLANEOUS) ×2 IMPLANT
COVER WAND RF STERILE (DRAPES) ×2 IMPLANT
DERMABOND ADVANCED (GAUZE/BANDAGES/DRESSINGS) ×1
DERMABOND ADVANCED .7 DNX12 (GAUZE/BANDAGES/DRESSINGS) ×1 IMPLANT
DRAIN CHANNEL JP 15F RND 16 (MISCELLANEOUS) ×3 IMPLANT
DRAPE LAPAROTOMY TRNSV 106X77 (MISCELLANEOUS) ×2 IMPLANT
DRSG GAUZE FLUFF 36X18 (GAUZE/BANDAGES/DRESSINGS) ×2 IMPLANT
ELECT REM PT RETURN 9FT ADLT (ELECTROSURGICAL) ×2
ELECTRODE REM PT RTRN 9FT ADLT (ELECTROSURGICAL) ×1 IMPLANT
GAUZE SPONGE 4X4 12PLY STRL (GAUZE/BANDAGES/DRESSINGS) ×2 IMPLANT
GLOVE SURG ENC MOIS LTX SZ6.5 (GLOVE) ×2 IMPLANT
GLOVE SURG UNDER POLY LF SZ6.5 (GLOVE) ×2 IMPLANT
GOWN STRL REUS W/ TWL LRG LVL3 (GOWN DISPOSABLE) ×2 IMPLANT
GOWN STRL REUS W/TWL LRG LVL3 (GOWN DISPOSABLE) ×4
KIT TURNOVER KIT A (KITS) ×2 IMPLANT
LABEL OR SOLS (LABEL) ×2 IMPLANT
MANIFOLD NEPTUNE II (INSTRUMENTS) ×2 IMPLANT
MARGIN MAP 10MM (MISCELLANEOUS) ×1 IMPLANT
PACK BASIN MAJOR ARMC (MISCELLANEOUS) ×2 IMPLANT
PAD ABD DERMACEA PRESS 5X9 (GAUZE/BANDAGES/DRESSINGS) IMPLANT
SLEVE PROBE SENORX GAMMA FIND (MISCELLANEOUS) ×2 IMPLANT
SPONGE DRAIN TRACH 4X4 STRL 2S (GAUZE/BANDAGES/DRESSINGS) ×1 IMPLANT
SUT ETHILON 3-0 FS-10 30 BLK (SUTURE) ×2
SUT ETHILON 4-0 (SUTURE)
SUT ETHILON 4-0 FS2 18XMFL BLK (SUTURE)
SUT MNCRL 4-0 (SUTURE) ×4
SUT MNCRL 4-0 27XMFL (SUTURE) ×2
SUT SILK 2 0 SH (SUTURE) IMPLANT
SUT SILK 3-0 (SUTURE) ×2 IMPLANT
SUT VIC AB 3-0 SH 27 (SUTURE) ×8
SUT VIC AB 3-0 SH 27X BRD (SUTURE) ×2 IMPLANT
SUT VIC AB 3-0 SH 8-18 (SUTURE) ×2 IMPLANT
SUT VICRYL+ 3-0 144IN (SUTURE) ×2 IMPLANT
SUTURE EHLN 3-0 FS-10 30 BLK (SUTURE) ×1 IMPLANT
SUTURE ETHLN 4-0 FS2 18XMF BLK (SUTURE) IMPLANT
SUTURE MNCRL 4-0 27XMF (SUTURE) ×2 IMPLANT

## 2020-05-04 NOTE — Anesthesia Procedure Notes (Signed)
Procedure Name: LMA Insertion Date/Time: 05/04/2020 9:13 AM Performed by: Philbert Riser, CRNA Patient Re-evaluated:Patient Re-evaluated prior to induction Oxygen Delivery Method: Circle system utilized, Simple face mask and Non-rebreather mask Preoxygenation: Pre-oxygenation with 100% oxygen Induction Type: IV induction LMA Size: 3.5

## 2020-05-04 NOTE — Op Note (Signed)
Preoperative diagnosis: Invasive Mammary Carcinoma of the right breast.  Postoperative diagnosis: Invasive Mammary Carcinoma of the right breast.   Procedure: Right total mastectomy.                      Right axillary Sentinel Lymph node biopsy  Anesthesia: GETA  Surgeon: Dr. Windell Moment  Wound Classification: Clean  Indications: Patient is a 80 y.o. female who had an abnormal mammogram that on workup with core needle biopsy was found to be invasive mammary carcinoma. After discussion of alternatives, due to previous right breast radiation therapy, it was decided to proceed with total mastectomy.  Findings: 1. No palpable abnormality 2. Adequate hemostasis  Description of procedure: The patient was brought to the operating room and general anesthesia was induced. A time-out was completed verifying correct patient, procedure, site, positioning, and implant(s) and/or special equipment prior to beginning this procedure. I personally injected blue ink for intra operative finding of sentinel lymph node. The breast, chest wall, axilla, and upper arm and neck were prepped and draped in the usual sterile fashion.  A skin incision was made that encompassed the nipple-areola complex and the previous biopsy scar and passed in an oblique direction across the breast. Flaps were raised in the avascular plane between subcutaneous tissue and breast tissue from the clavicle superiorly, the sternum medially, the anterior rectus sheath inferiorly, and past the lateral border of the pectoralis major muscle laterally. Hemostasis was achieved in the flaps. Next, the breast tissue and underlying pectoralis fascia were excised from the pectoralis major muscle, progressing from medially to laterally. At the lateral border of the pectoralis major muscle, the breast tissue was swung laterally and a lateral pedicle identified where breast tissue gave way to fat of axilla. The lateral pedicle was incised and the specimen  removed.   A hand-held gamma probe was used to identify the location of the hottest spot in the axilla. Dissection was carried down until subdermal facias was advanced. The probe was placed and again, the point of maximal count was found. Dissection continue until nodule was identified which was consistent with a blue inked node. The probe was placed in contact with the node. The node was excised in its entirety. No additional hot spots were identified. An additional blue inked node was identified without nuclear activity. This node was completely excised as well. No clinically abnormal nodes were palpated. The procedure was terminated.    The wound was irrigated and hemostasis was achieved. Closed suction drains were brought into the operative field through a separate stab incision and sutured to the skin with a 3-0 nylon suture. The wound was closed with interrupted 3-0 Vicryl to the subcutaneous layer, followed by a subcuticular layer of Monocryl 4-0. The wound was dressed.  The patient tolerated the procedure well and was taken to the postanesthesia care unit in stable condition.     Sentinel Node Biopsy Synoptic Operative Report  Operation performed with curative intent:Yes  Tracer(s) used to identify sentinel nodes in the upfront surgery (non-neoadjuvant) setting (select all that apply):Dye and Radioactive Tracer  Tracer(s) used to identify sentinel nodes in the neoadjuvant setting (select all that apply):N/A  All nodes (colored or non-colored) present at the end of a dye-filled lymphatic channel were removed:Yes   All significantly radioactive nodes were removed:Yes  All palpable suspicious nodes were removed:N/A  Biopsy-proven positive nodes marked with clips prior to chemotherapy were identified and removed:N/A  Specimen:  Right Breast  Sentinel lymph nodes #1, #2  Complications: None  Estimated Blood Loss: 10 mL

## 2020-05-04 NOTE — Transfer of Care (Signed)
Immediate Anesthesia Transfer of Care Note  Patient: Cheryl Morris  Procedure(s) Performed: TOTAL  MASTECTOMY WITH AXILLARY SENTINEL LYMPH NODE BIOPSY (Right )  Patient Location: PACU  Anesthesia Type:General  Level of Consciousness: drowsy  Airway & Oxygen Therapy: Patient Spontanous Breathing and Patient connected to face mask oxygen  Post-op Assessment: Report given to RN and Post -op Vital signs reviewed and stable  Post vital signs: Reviewed and stable  Last Vitals:  Vitals Value Taken Time  BP    Temp    Pulse 68 05/04/20 1124  Resp 11 05/04/20 1124  SpO2 100 % 05/04/20 1124  Vitals shown include unvalidated device data.  Last Pain:  Vitals:   05/04/20 0838  TempSrc: Oral  PainSc: 0-No pain         Complications: No complications documented.

## 2020-05-04 NOTE — Anesthesia Postprocedure Evaluation (Signed)
Anesthesia Post Note  Patient: Cheryl Morris  Procedure(s) Performed: TOTAL  MASTECTOMY WITH AXILLARY SENTINEL LYMPH NODE BIOPSY (Right )  Patient location during evaluation: PACU Anesthesia Type: General Level of consciousness: awake and alert Pain management: pain level controlled Vital Signs Assessment: post-procedure vital signs reviewed and stable Respiratory status: spontaneous breathing and respiratory function stable Cardiovascular status: stable Anesthetic complications: no   No complications documented.   Last Vitals:  Vitals:   05/04/20 1119 05/04/20 1130  BP: (!) 143/76 (!) 135/55  Pulse: 72 72  Resp: 12 13  Temp: (!) 36.3 C   SpO2: 100% 100%    Last Pain:  Vitals:   05/04/20 1130  TempSrc:   PainSc: 0-No pain                 Darel Ricketts K

## 2020-05-04 NOTE — Anesthesia Preprocedure Evaluation (Signed)
Anesthesia Evaluation  Patient identified by MRN, date of birth, ID band Patient awake    Reviewed: Allergy & Precautions, H&P , NPO status , Patient's Chart, lab work & pertinent test results, reviewed documented beta blocker date and time   History of Anesthesia Complications (+) PONV, Family history of anesthesia reaction and history of anesthetic complications  Airway Mallampati: III  TM Distance: >3 FB Neck ROM: full    Dental  (+) Dental Advidsory Given, Caps, Missing, Teeth Intact, Poor Dentition   Pulmonary neg pulmonary ROS,    Pulmonary exam normal breath sounds clear to auscultation       Cardiovascular Exercise Tolerance: Good hypertension, (-) angina(-) Past MI and (-) Cardiac Stents Normal cardiovascular exam(-) dysrhythmias (-) Valvular Problems/Murmurs Rhythm:regular Rate:Normal     Neuro/Psych negative neurological ROS  negative psych ROS   GI/Hepatic Neg liver ROS, hiatal hernia, GERD  ,  Endo/Other  negative endocrine ROS  Renal/GU negative Renal ROS  negative genitourinary   Musculoskeletal   Abdominal   Peds  Hematology negative hematology ROS (+)   Anesthesia Other Findings Past Medical History: No date: Arthritis 12/08/2019: Basal cell carcinoma     Comment:  L nasal dorsum No date: Benign essential hypertension No date: Bradycardia 2003: Breast cancer, right (HCC)     Comment:  DCIS No date: Family history of adverse reaction to anesthesia     Comment:  brother - PONV No date: Fibrocystic breast No date: GERD (gastroesophageal reflux disease) 04/14/2018: History of hiatal hernia No date: HTN (hypertension) No date: Hyperlipidemia No date: IBS (irritable bowel syndrome) No date: Personal history of radiation therapy No date: PONV (postoperative nausea and vomiting) No date: Precordial pain 04/2020: Recurrent breast cancer, right (HCC)     Comment:  ER/PR (+), HER2/neu equivocal  invasive mammary carcinoma No date: Vertigo     Comment:  (x1) over 10 yrs ago   Reproductive/Obstetrics negative OB ROS                             Anesthesia Physical Anesthesia Plan  ASA: II  Anesthesia Plan: General   Post-op Pain Management:    Induction: Intravenous  PONV Risk Score and Plan: 4 or greater and Ondansetron, Dexamethasone and Treatment may vary due to age or medical condition  Airway Management Planned: LMA  Additional Equipment:   Intra-op Plan:   Post-operative Plan: Extubation in OR  Informed Consent: I have reviewed the patients History and Physical, chart, labs and discussed the procedure including the risks, benefits and alternatives for the proposed anesthesia with the patient or authorized representative who has indicated his/her understanding and acceptance.     Dental Advisory Given  Plan Discussed with: Anesthesiologist, CRNA and Surgeon  Anesthesia Plan Comments:         Anesthesia Quick Evaluation

## 2020-05-04 NOTE — Interval H&P Note (Signed)
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  The various methods of treatment have been discussed with the patient and family. After consideration of risks, benefits and other options for treatment, the patient has consented to  Procedure(s): TOTAL  MASTECTOMY WITH AXILLARY SENTINEL LYMPH NODE BIOPSY (Right) as a surgical intervention.  The patient's history has been reviewed, patient examined, no change in status, stable for surgery.  I have reviewed the patient's chart and labs.  Questions were answered to the patient's satisfaction.     Herbert Pun

## 2020-05-05 ENCOUNTER — Encounter: Payer: Self-pay | Admitting: General Surgery

## 2020-05-05 DIAGNOSIS — C50511 Malignant neoplasm of lower-outer quadrant of right female breast: Secondary | ICD-10-CM | POA: Diagnosis not present

## 2020-05-05 MED ORDER — HYDROCODONE-ACETAMINOPHEN 5-325 MG PO TABS: 1.0000 | ORAL_TABLET | ORAL | 0 refills | Status: DC | PRN

## 2020-05-05 NOTE — Discharge Summary (Signed)
Patient ID: Cheryl Morris MRN: [id number] DOB/AGE: 04/03/1940 80 y.o.  Admit date: 05/04/2020 Discharge date: 05/05/2020   Discharge Diagnoses:  Active Problems:   Breast cancer [facility])   Procedures: Right breast total mastectomy with sentinel node biopsy  Hospital Course: Patient underwent right total mastectomy due to right breast cancer.  She tolerated the procedure well.  This morning patient with pain control.  Drain output adequate.  Wound is healthy with viable flaps with adequate capillary refill.  No sign of fluid collection.  Small bruise on the axillary area.  Physical Exam Cardiovascular:     Rate and Rhythm: Normal rate and regular rhythm.  Pulmonary:     Effort: Pulmonary effort is normal.     Breath sounds: Normal breath sounds.  Abdominal:     General: Abdomen is flat.  Neurological:     Mental Status: She is alert and oriented to person, place, and time.      Consults: None  Disposition: Discharge disposition: 01-Home or Self Care       Discharge Instructions    Diet - low sodium heart healthy   Complete by: As directed    Diet - low sodium heart healthy   Complete by: As directed    Increase activity slowly   Complete by: As directed    Increase activity slowly   Complete by: As directed      Allergies as of 05/05/2020      Reactions   Azithromycin Nausea Only   Cefuroxime Axetil Nausea And Vomiting   Doxycycline Nausea Only   Thrush    Levaquin [levofloxacin] Nausea Only   Lisinopril    Hypotension    Other Hives   Pneumovac    Penicillin V Potassium Swelling   Childhood reaction - arm swelling   Sulfa Antibiotics Nausea And Vomiting   Naproxen Rash      Medication List    TAKE these medications   amLODipine 10 MG tablet Commonly known as: NORVASC Take 10 mg by mouth daily.   aspirin 81 MG chewable tablet Chew 81 mg by mouth daily.   CALCIUM 500 + D PO Take 1 tablet by mouth daily.   cetirizine 10 MG  tablet Commonly known as: ZYRTEC Take 10 mg by mouth daily as needed for allergies.   Cholecalciferol 25 MCG (1000 UT) Tbdp Take 1,000 Units by mouth daily.   fluocinonide 0.05 % external solution Commonly known as: LIDEX Apply to affected areas of scalp at bedtime as needed. What changed:   how much to take  how to take this  when to take this  reasons to take this  additional instructions   guaiFENesin 600 MG 12 hr tablet Commonly known as: MUCINEX Take 600 mg by mouth 2 (two) times daily as needed for to loosen phlegm.   hydrochlorothiazide 12.5 MG tablet Commonly known as: HYDRODIURIL Take 12.5 mg by mouth daily.   HYDROcodone-acetaminophen 5-325 MG tablet Commonly known as: NORCO/VICODIN Take 1 tablet by mouth every 4 (four) hours as needed for moderate pain.   losartan 50 MG tablet Commonly known as: COZAAR Take 50 mg by mouth daily.   meclizine 25 MG tablet Commonly known as: ANTIVERT Take 25 mg by mouth 3 (three) times daily as needed for dizziness.   pantoprazole 40 MG tablet Commonly known as: PROTONIX Take 40 mg by mouth daily.   PreserVision AREDS 2 Caps Take 1 tablet by mouth 2 (two) times daily.   CENTRUM SILVER PO Take 1 tablet  by mouth daily.   REFRESH RELIEVA OP Place 1 drop into both eyes 2 (two) times daily as needed (dry eyes).   simvastatin 20 MG tablet Commonly known as: ZOCOR Take 20 mg by mouth daily.   sodium chloride 0.65 % nasal spray Commonly known as: OCEAN Place 1 spray into the nose 2 (two) times daily as needed for congestion.       Follow-up Information    Herbert Pun, MD. Schedule an appointment as soon as possible for a visit in 1 week(s).   Specialty: General Surgery Contact information: [address] [place] [address] [phone]

## 2020-05-05 NOTE — Discharge Instructions (Signed)
  Diet: Resume home heart healthy regular diet.   Activity: No heavy lifting >20 pounds (children, pets, laundry, garbage) or strenuous activity until follow-up, but light activity and walking are encouraged. Do not drive or drink alcohol if taking narcotic pain medications.  Wound care: Use wipes to clean the wound area.   Empty drain at least one time per day and chart output.   Medications: Resume all home medications. For mild to moderate pain: acetaminophen (Tylenol) or ibuprofen (if no kidney disease). Combining Tylenol with alcohol can substantially increase your risk of causing liver disease. Narcotic pain medications, if prescribed, can be used for severe pain, though may cause nausea, constipation, and drowsiness. Do not combine Tylenol and Norco within a 6 hour period as Norco contains Tylenol. If you do not need the narcotic pain medication, you do not need to fill the prescription.  Call office [phone] at any time if any questions, worsening pain, fevers/chills, bleeding, drainage from incision site, or other concerns.

## 2020-05-05 NOTE — Progress Notes (Signed)
Discharge instructions given to patient and son. Verbalized understanding. No acute distress at this time. Son to transport patient home. Wheeled out via wheelchair to medical mall entrance.

## 2020-05-09 ENCOUNTER — Other Ambulatory Visit: Payer: Self-pay | Admitting: Anatomic Pathology & Clinical Pathology

## 2020-05-09 LAB — SURGICAL PATHOLOGY

## 2020-05-11 ENCOUNTER — Other Ambulatory Visit: Payer: PPO

## 2020-05-11 ENCOUNTER — Encounter: Payer: PPO | Admitting: Licensed Clinical Social Worker

## 2020-05-14 NOTE — Progress Notes (Unsigned)
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  INTERVAL HISTORY: Patient returns to clinic today for further evaluation, discussion of her final pathology results, and initiation of letrozole.  She recently underwent right mastectomy and tolerated her surgical procedure well without significant side effects. She currently feels well and is asymptomatic.  She has no neurologic complaints.  She denies any recent fevers or illnesses.  She has a good appetite and denies weight loss.  She has no chest pain, shortness of breath, cough, or hemoptysis.  She denies any nausea, vomiting, constipation, or diarrhea.  She has no urinary complaints.  Patient offers no specific complaints today.  REVIEW OF SYSTEMS:   Review of Systems  Constitutional: Negative.  Negative for fever, malaise/fatigue and weight loss.  Respiratory: Negative.  Negative for cough, hemoptysis and shortness of breath.   Cardiovascular: Negative.  Negative for chest pain and leg swelling.  Gastrointestinal: Negative.  Negative for abdominal pain.  Genitourinary: Negative.  Negative for dysuria.  Musculoskeletal: Negative.  Negative for back pain.  Skin: Negative.  Negative for rash.  Neurological: Negative.  Negative for dizziness, focal weakness, weakness and headaches.  Psychiatric/Behavioral: Negative.  The patient is not nervous/anxious.     As per HPI. Otherwise, a complete review of systems is negative.  PAST MEDICAL HISTORY: Past Medical History:  Diagnosis Date  . Arthritis   . Basal cell carcinoma 12/08/2019   L nasal dorsum  . Benign essential  hypertension   . Bradycardia   . 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 Family history of adverse reaction to anesthesia    brother - PONV  . Fibrocystic breast   . GERD (gastroesophageal reflux disease)   . History of hiatal hernia 04/14/2018  . HTN (hypertension)   . Hyperlipidemia   . IBS (irritable bowel syndrome)   . Personal history of radiation therapy   . PONV (postoperative nausea and vomiting)   . Precordial pain   . Recurrent breast cancer, right ([place]) 04/2020   ER/PR (+), HER2/neu equivocal invasive mammary carcinoma  . Vertigo    (x1) over 10 yrs ago    PAST SURGICAL HISTORY: Past Surgical History:  Procedure Laterality Date  . ABDOMINAL HYSTERECTOMY     age 55  . BREAST BIOPSY Right 2003   +  . BREAST BIOPSY Right 04/13/2020   calcs, Affirm Bx-"X" clip path pending  . BREAST LUMPECTOMY Right    f/u radiation   . CATARACT EXTRACTION W/PHACO Right 08/06/2018   Procedure: CATARACT EXTRACTION PHACO AND INTRAOCULAR LENS PLACEMENT ([place])  RIGHT;  Surgeon: Leandrew Koyanagi, MD;  Location: [facility];  Service: Ophthalmology;  Laterality: Right;  . CATARACT EXTRACTION W/PHACO Left 08/27/2018   Procedure: CATARACT EXTRACTION PHACO AND INTRAOCULAR LENS PLACEMENT ([place])  LEFT;  Surgeon: Leandrew Koyanagi, MD;  Location: [facility];  Service: Ophthalmology;  Laterality: Left;  . COLONOSCOPY WITH PROPOFOL N/A 04/14/2018   Procedure: COLONOSCOPY WITH PROPOFOL;  Surgeon: Lollie Sails, MD;  Location: [facility] ENDOSCOPY;  Service: Endoscopy;  Laterality: N/A;  . ESOPHAGOGASTRODUODENOSCOPY (EGD) WITH PROPOFOL N/A 04/14/2018   Procedure: ESOPHAGOGASTRODUODENOSCOPY (EGD) WITH PROPOFOL;  Surgeon: Lollie Sails, MD;  Location: [facility] ENDOSCOPY;  Service: Endoscopy;  Laterality: N/A;  . EYE SURGERY    . PARTIAL MASTECTOMY WITH AXILLARY SENTINEL LYMPH NODE BIOPSY Right 05/04/2020   Procedure: TOTAL  MASTECTOMY WITH AXILLARY SENTINEL LYMPH NODE BIOPSY;  Surgeon:  Herbert Pun, MD;  Location: [facility] ORS;  Service: General;  Laterality: Right;    FAMILY HISTORY: Family History  Problem Relation Age of Onset  . Breast cancer Neg Hx     ADVANCED DIRECTIVES (Y/N):  N  HEALTH MAINTENANCE: Social History   Tobacco Use  . Smoking status: Never Smoker  . Smokeless tobacco: Never Used  Vaping Use  . Vaping Use: Never used  Substance Use Topics  . Alcohol use: Not Currently  . Drug use: Never     Colonoscopy:  PAP:  Bone density:  Lipid panel:  Allergies  Allergen Reactions  . Azithromycin Nausea Only  . Cefuroxime Axetil Nausea And Vomiting  . Doxycycline Nausea Only    Thrush   . Levaquin [Levofloxacin] Nausea Only  . Lisinopril     Hypotension   . Other Hives    Pneumovac   . Penicillin V Potassium Swelling    Childhood reaction - arm swelling  . Sulfa Antibiotics Nausea And Vomiting  . Naproxen Rash    Current Outpatient Medications  Medication Sig Dispense Refill  . acetaminophen (TYLENOL) 325 MG tablet Take 650 mg by mouth every 6 (six) hours as needed.    Marland Kitchen amLODipine (NORVASC) 10 MG tablet Take 10 mg by mouth daily.    . Calcium Carb-Cholecalciferol (CALCIUM 500 + D PO) Take 1 tablet by mouth daily.    . Carboxymethylcellul-Glycerin (REFRESH RELIEVA OP) Place 1 drop into both eyes 2 (two) times daily as needed (dry eyes).    . Cholecalciferol 25 MCG (1000 UT) TBDP Take 1,000 Units by mouth daily.    . fluocinonide (LIDEX) 0.05 % external solution Apply to affected areas of scalp at bedtime as needed. (Patient taking differently: Apply 1 application topically at bedtime as needed (scalp dryness).) 60 mL 3  . hydrochlorothiazide (HYDRODIURIL) 12.5 MG tablet Take 12.5 mg by mouth daily.    Marland Kitchen letrozole (FEMARA) 2.5 MG tablet Take 1 tablet (2.5 mg total) by mouth daily. 30 tablet 3  . losartan (COZAAR) 50 MG tablet Take 50 mg by mouth daily.    . Multiple Vitamins-Minerals (CENTRUM SILVER PO) Take 1 tablet by mouth  daily.    . Multiple Vitamins-Minerals (PRESERVISION AREDS 2) CAPS Take 1 tablet by mouth 2 (two) times daily.    . pantoprazole (PROTONIX) 40 MG tablet Take 40 mg by mouth daily.    . simvastatin (ZOCOR) 20 MG tablet Take 20 mg by mouth daily.    . sodium chloride (OCEAN) 0.65 % nasal spray Place 1 spray into the nose 2 (two) times daily as needed for congestion.    Marland Kitchen aspirin 81 MG chewable tablet Chew 81 mg by mouth daily. (Patient not taking: Reported on 05/19/2020)    . cetirizine (ZYRTEC) 10 MG tablet Take 10 mg by mouth daily as needed for allergies. (Patient not taking: Reported on 05/19/2020)    . guaiFENesin (MUCINEX) 600 MG 12 hr tablet Take 600 mg by mouth 2 (two) times daily as needed for to loosen phlegm. (Patient not taking: Reported on 05/19/2020)    . HYDROcodone-acetaminophen (NORCO/VICODIN) 5-325 MG tablet Take 1 tablet by mouth every 4 (four) hours as needed for moderate pain. (Patient not taking: Reported on 05/19/2020) 16 tablet 0  .  lidocaine-prilocaine (EMLA) cream Apply topically.    . meclizine (ANTIVERT) 25 MG tablet Take 25 mg by mouth 3 (three) times daily as needed for dizziness. (Patient not taking: Reported on 05/19/2020)     No current facility-administered medications for this visit.    OBJECTIVE: Vitals:   05/19/20 1008  BP: (!) 153/60  Pulse: 66  Temp: (!) 96.9 F (36.1 C)  SpO2: 98%     Body mass index is 27.57 kg/m.    ECOG FS:0 - Asymptomatic  General: Well-developed, well-nourished, no acute distress. Eyes: Pink conjunctiva, anicteric sclera. HEENT: Normocephalic, moist mucous membranes. Breast: Right mastectomy. Lungs: No audible wheezing or coughing. Heart: Regular rate and rhythm. Abdomen: Soft, nontender, no obvious distention. Musculoskeletal: No edema, cyanosis, or clubbing. Neuro: Alert, answering all questions appropriately. Cranial nerves grossly intact. Skin: No rashes or petechiae noted. Psych: Normal affect.  LAB RESULTS:  Lab  Results  Component Value Date   NA 143 03/25/2009   K 3.9 03/25/2009   CL 102 03/25/2009   CO2 26 03/25/2009   GLUCOSE 106 (H) 03/25/2009   BUN 18 03/25/2009   CREATININE 1.18 03/25/2009   CALCIUM 9.3 03/25/2009   PROT 6.6 03/25/2009   ALBUMIN 4.4 03/25/2009   AST 17 03/25/2009   ALT 15 03/25/2009   ALKPHOS 48 03/25/2009   BILITOT 0.7 03/25/2009    Lab Results  Component Value Date   WBC 5.8 03/25/2009   NEUTROABS 3.5 03/25/2009   HGB 14.1 03/25/2009   HCT 41.1 03/25/2009   MCV 91.9 03/25/2009   PLT 194 03/25/2009     STUDIES: NM Sentinel Node Inj-No Rpt (Breast)  Result Date: 05/04/2020 Sulfur colloid was injected by the nuclear medicine technologist for melanoma sentinel node.    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  PLAN:  1. 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  This was a stage Ia (T1c, N0, M0) grade 1 ER/PR positive, HER-2 negative malignancy.  Patient underwent lumpectomy with adjuvant XRT at that time.  She also took 5 years of tamoxifen.  This is unlikely a recurrence but rather a second primary.  She underwent right mastectomy on May 04, 2020.  Given the small size of her malignancy she does not require Oncotype testing and chemotherapy is not necessary.  She does not need adjuvant XRT.  Patient was given a prescription for letrozole today which she will take for 5 years completing treatment in March 2027.  Will also get a baseline bone mineral density in the next 1 to 2 weeks.  Return to clinic in 3 months for routine evaluation.  2.  Genetics: Referral has been made.  I spent a total of 30 minutes reviewing chart data, face-to-face evaluation with the patient, counseling and coordination of care as detailed above.   Patient  expressed understanding and was in agreement with this plan. She also understands that She can call clinic at any time with any questions, concerns, or complaints.   Cancer Staging Primary cancer of lower-outer quadrant of right breast [facility]) Staging form: Breast, AJCC 8th Edition - Pathologic stage from 05/20/2020: Stage IA (pT1a, pN0, cM0, G1, ER+, PR+, HER2-) - Signed by Lloyd Huger, MD on 05/20/2020 Stage prefix: Initial diagnosis Histologic grading system: 3 grade system   Lloyd Huger, MD  05/20/2020 6:55 AM

## 2020-05-19 ENCOUNTER — Encounter: Payer: Self-pay | Admitting: Oncology

## 2020-05-19 ENCOUNTER — Inpatient Hospital Stay: Payer: PPO | Attending: Oncology | Admitting: Oncology

## 2020-05-19 VITALS — BP 153/60 | HR 66 | Temp 96.9°F | Wt 176.0 lb

## 2020-05-19 DIAGNOSIS — Z170 Estrogen receptor positive status [ER+]: Secondary | ICD-10-CM | POA: Insufficient documentation

## 2020-05-19 DIAGNOSIS — Z853 Personal history of malignant neoplasm of breast: Secondary | ICD-10-CM | POA: Insufficient documentation

## 2020-05-19 DIAGNOSIS — Z923 Personal history of irradiation: Secondary | ICD-10-CM | POA: Diagnosis not present

## 2020-05-19 DIAGNOSIS — Z9011 Acquired absence of right breast and nipple: Secondary | ICD-10-CM | POA: Insufficient documentation

## 2020-05-19 DIAGNOSIS — C50511 Malignant neoplasm of lower-outer quadrant of right female breast: Secondary | ICD-10-CM | POA: Diagnosis not present

## 2020-05-19 MED ORDER — LETROZOLE 2.5 MG PO TABS: 2.5000 mg | ORAL_TABLET | Freq: Every day | ORAL | 3 refills | Status: DC

## 2020-05-19 NOTE — Progress Notes (Signed)
Patient here for oncology follow-up appointment, expresses no complaints or concerns at this time.    

## 2020-06-07 ENCOUNTER — Other Ambulatory Visit: Payer: Self-pay

## 2020-06-07 ENCOUNTER — Encounter: Payer: Self-pay | Admitting: Dermatology

## 2020-06-07 ENCOUNTER — Ambulatory Visit: Payer: PPO | Admitting: Dermatology

## 2020-06-07 DIAGNOSIS — L570 Actinic keratosis: Secondary | ICD-10-CM | POA: Diagnosis not present

## 2020-06-07 DIAGNOSIS — D1800 Hemangioma unspecified site: Secondary | ICD-10-CM

## 2020-06-07 DIAGNOSIS — L578 Other skin changes due to chronic exposure to nonionizing radiation: Secondary | ICD-10-CM

## 2020-06-07 DIAGNOSIS — Z85828 Personal history of other malignant neoplasm of skin: Secondary | ICD-10-CM | POA: Diagnosis not present

## 2020-06-07 DIAGNOSIS — L821 Other seborrheic keratosis: Secondary | ICD-10-CM

## 2020-06-07 DIAGNOSIS — L738 Other specified follicular disorders: Secondary | ICD-10-CM

## 2020-06-07 DIAGNOSIS — L814 Other melanin hyperpigmentation: Secondary | ICD-10-CM

## 2020-06-07 DIAGNOSIS — L409 Psoriasis, unspecified: Secondary | ICD-10-CM

## 2020-06-07 DIAGNOSIS — L853 Xerosis cutis: Secondary | ICD-10-CM

## 2020-06-07 DIAGNOSIS — L720 Epidermal cyst: Secondary | ICD-10-CM | POA: Diagnosis not present

## 2020-06-07 DIAGNOSIS — L820 Inflamed seborrheic keratosis: Secondary | ICD-10-CM

## 2020-06-07 DIAGNOSIS — D229 Melanocytic nevi, unspecified: Secondary | ICD-10-CM

## 2020-06-07 DIAGNOSIS — D2239 Melanocytic nevi of other parts of face: Secondary | ICD-10-CM

## 2020-06-07 MED ORDER — FLUOCINONIDE 0.05 % EX SOLN: 1.0000 "application " | Freq: Every evening | CUTANEOUS | 3 refills | Status: DC | PRN

## 2020-06-07 NOTE — Patient Instructions (Addendum)
Prior to procedure, discussed risks of blister formation, small wound, skin dyspigmentation, or rare scar following cryotherapy.   Cryotherapy Aftercare  . Wash gently with soap and water everyday.   Marland Kitchen Apply Vaseline and Band-Aid daily until healed.  Dry Skin Care  What causes dry skin?  Dry skin is common and results from inadequate moisture in the outer skin layers. Dry skin usually results from the excessive loss of moisture from the skin surface. This occurs due to two major factors: 1. Normally the skin's oil glands deposit a layer of oil on the skin's surface. This layer of oil prevents the loss of moisture from the skin. Exposure to soaps, cleaners, solvents, and disinfectants removes this oily film, allowing water to escape. 2. Water loss from the skin increases when the humidity is low. During winter months we spend a lot of time indoors where the air is heated. Heated air has very low humidity. This also contributes to dry skin.  A tendency for dry skin may accompany such disorders as eczema. Also, as people age, the number of functioning oil glands decreases, and the tendency toward dry skin can be a sensation of skin tightness when emerging from the shower.  How do I manage dry skin?  1. Humidify your environment. This can be accomplished by using a humidifier in your bedroom at night during winter months. 2. Bathing can actually put moisture back into your skin if done right. Take the following steps while bathing to sooth dry skin:  Avoid hot water, which only dries the skin and makes itching worse. Use warm water.  Avoid washcloths or extensive rubbing or scrubbing.  Use mild soaps like unscented Dove, Oil of Olay, Cetaphil, Basis, or CeraVe.  If you take baths rather than showers, rinse off soap residue with clean water before getting out of tub.  Once out of the shower/tub, pat dry gently with a soft towel. Leave your skin damp.  While still damp, apply any medicated  ointment/cream you were prescribed to the affected areas. After you apply your medicated ointment/cream, then apply your moisturizer to your whole body.This is the most important step in dry skin care. If this is omitted, your skin will continue to be dry.  The choice of moisturizer is also very important. In general, lotion will not provider enough moisture to severely dry skin because it is water based. You should use an ointment or cream. Moisturizers should also be unscented. Good choices include Vaseline (plain petrolatum), Aquaphor, Cetaphil, CeraVe, Vanicream, DML Forte, Aveeno moisture, or Eucerin Cream.  Bath oils can be helpful, but do not replace the application of moisturizer after the bath. In addition, they make the tub slippery causing an increased risk for falls. Therefore, we do not recommend their use.  If you have any questions or concerns for your doctor, please call our main line at [phone] and press option 4 to reach your doctor's medical assistant. If no one answers, please leave a voicemail as directed and we will return your call as soon as possible. Messages left after 4 pm will be answered the following business day.   You may also send [place] a message via [place]. We typically respond to MyChart messages within 1-2 business days.  For prescription refills, please ask your pharmacy to contact our office. Our fax number is [phone].  If you have an urgent issue when the clinic is closed that cannot wait until the next business day, you can page your doctor at the  number below.    Please note that while we do our best to be available for urgent issues outside of office hours, we are not available 24/7.   If you have an urgent issue and are unable to reach [place], you may choose to seek medical care at your doctor's office, retail clinic, urgent care center, or emergency room.  If you have a medical emergency, please immediately call 911 or go to the emergency  department.  Pager Numbers  - Dr. Nehemiah Massed: [phone]  - Dr. Laurence Ferrari: [phone]  - Dr. Nicole Kindred: [phone]  In the event of inclement weather, please call our main line at [phone] for an update on the status of any delays or closures.  Dermatology Medication Tips: Please keep the boxes that topical medications come in in order to help keep track of the instructions about where and how to use these. Pharmacies typically print the medication instructions only on the boxes and not directly on the medication tubes.   If your medication is too expensive, please contact our office at [phone] option 4 or send [place] a message through [place].   We are unable to tell what your co-pay for medications will be in advance as this is different depending on your insurance coverage. However, we may be able to find a substitute medication at lower cost or fill out paperwork to get insurance to cover a needed medication.   If a prior authorization is required to get your medication covered by your insurance company, please allow [place] 1-2 business days to complete this process.  Drug prices often vary depending on where the prescription is filled and some pharmacies may offer cheaper prices.  The website www.goodrx.com contains coupons for medications through different pharmacies. The prices here do not account for what the cost may be with help from insurance (it may be cheaper with your insurance), but the website can give you the price if you did not use any insurance.  - You can print the associated coupon and take it with your prescription to the pharmacy.  - You may also stop by our office during regular business hours and pick up a GoodRx coupon card.  - If you need your prescription sent electronically to a different pharmacy, notify our office through [facility] or by phone at [phone] option 4.

## 2020-06-07 NOTE — Progress Notes (Signed)
Follow-Up Visit   Subjective  Cheryl Morris is a 80 y.o. female who presents for the following: Follow-up (Patient here for 6 month follow-up to check sun-exposed areas. She has itchy spots on her back and a growth on her left posterior thigh. History of BCC of the left nasal dorsum treated with [facility] on January 04, 2020.).  She has recurrence of breast cancer since last visit and had a mastectomy.  She gets colonscopy every 3 years.   The following portions of the chart were reviewed this encounter and updated as appropriate:       Review of Systems:  No other skin or systemic complaints except as noted in HPI or Assessment and Plan.  Objective  Well appearing patient in no apparent distress; mood and affect are within normal limits.  A focused examination was performed including face, arms, hands, back, left leg. Relevant physical exam findings are noted in the Assessment and Plan.  Objective  Left Nasal Dorsum: Well healed scar with no evidence of recurrence.   Objective  Lower back x 7 (7): Erythematous keratotic or waxy stuck-on papule   Objective  Right Medial Upper Thigh: Firm blue gray papule of the right upper medial thigh.  Objective  Right Anterior Jaw: 4.71mm flesh papule  Objective  Right Jaw at Mandible: Clear today.  Objective  Scalp: Mild scaling occipital scalp  Objective  L upper forehead x 1: Pink scaly macule   Assessment & Plan   Actinic Damage - chronic, secondary to cumulative UV radiation exposure/sun exposure over time - diffuse scaly erythematous macules with underlying dyspigmentation - Recommend daily broad spectrum sunscreen SPF 30+ to sun-exposed areas, reapply every 2 hours as needed.  - Recommend staying in the shade or wearing long sleeves, sun glasses (UVA+UVB protection) and wide brim hats (4-inch brim around the entire circumference of the hat). - Call for new or changing lesions.  Seborrheic Keratoses - Stuck-on,  waxy, tan-brown papules and/or plaques  - Benign-appearing - Discussed benign etiology and prognosis. - Observe - Call for any changes  Lentigines - Scattered tan macules - Due to sun exposure - Benign-appering, observe - Recommend daily broad spectrum sunscreen SPF 30+ to sun-exposed areas, reapply every 2 hours as needed. - Call for any changes  Hemangiomas - Red papules, including left posterior thigh - Discussed benign nature - Observe - Call for any changes  Xerosis - diffuse xerotic patches - recommend gentle, hydrating skin care - gentle skin care handout given   History of basal cell carcinoma (BCC) Left Nasal Dorsum  Clear. Observe for recurrence. Call clinic for new or changing lesions.  Recommend regular skin exams, daily broad-spectrum spf 30+ sunscreen use, and photoprotection.     Inflamed seborrheic keratosis (7) Lower back x 7  Prior to procedure, discussed risks of blister formation, small wound, skin dyspigmentation, or rare scar following cryotherapy.    Destruction of lesion - Lower back x 7  Destruction method: cryotherapy   Informed consent: discussed and consent obtained   Lesion destroyed using liquid nitrogen: Yes   Region frozen until ice ball extended beyond lesion: Yes   Outcome: patient tolerated procedure well with no complications   Post-procedure details: wound care instructions given    Epidermal inclusion cyst Right Medial Upper Thigh  Benign-appearing. Exam most consistent with an epidermal inclusion cyst. Discussed that a cyst is a benign growth that can grow over time and sometimes get irritated or inflamed. Recommend observation if it is not  bothersome. Discussed option of surgical excision to remove it if it is growing, symptomatic, or other changes noted. Please call for new or changing lesions so they can be evaluated.    Nevus Right Anterior Jaw  Benign-appearing.  Observation.  Call clinic for new or changing moles.   Recommend daily use of broad spectrum spf 30+ sunscreen to sun-exposed areas.       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 Observe for recurrence. Call clinic for new or changing lesions.  Recommend regular skin exams, daily broad-spectrum spf 30+ sunscreen use, and photoprotection.     Pt recently had breast cancer R breast detected on mammogram and treated with mastectomy. 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  Continue T-Sal shampoo, let sit several minutes before rinsing.    Reordered Medications fluocinonide (LIDEX) 0.05 % external solution  AK (actinic keratosis) L upper forehead x 1  Prior to procedure, discussed risks of blister formation, small wound, skin dyspigmentation, or rare scar following cryotherapy.   Recheck on follow-up  Destruction of lesion - L upper forehead x 1  Destruction method: cryotherapy   Informed consent: discussed and consent obtained   Lesion destroyed using liquid nitrogen: Yes   Region frozen until ice ball extended beyond lesion: Yes   Outcome: patient tolerated procedure well with no complications   Post-procedure details: wound care instructions given    Return in about 6 months (around 12/08/2020) for TBSE. Recheck forehead. Lindi Adie, CMA, am acting as scribe for Brendolyn Patty, MD .  Documentation: I have reviewed the above documentation for accuracy and completeness, and I agree with the above.  Brendolyn Patty MD

## 2020-06-08 ENCOUNTER — Ambulatory Visit
Admission: RE | Admit: 2020-06-08 | Discharge: 2020-06-08 | Disposition: A | Discharge status: Home / Self Care | Payer: PPO | Source: Ambulatory Visit | Attending: Oncology | Admitting: Oncology

## 2020-06-08 DIAGNOSIS — Z853 Personal history of malignant neoplasm of breast: Secondary | ICD-10-CM | POA: Diagnosis not present

## 2020-06-08 DIAGNOSIS — M85851 Other specified disorders of bone density and structure, right thigh: Secondary | ICD-10-CM | POA: Diagnosis not present

## 2020-06-08 DIAGNOSIS — Z13820 Encounter for screening for osteoporosis: Secondary | ICD-10-CM | POA: Diagnosis not present

## 2020-06-08 DIAGNOSIS — Z923 Personal history of irradiation: Secondary | ICD-10-CM | POA: Insufficient documentation

## 2020-06-08 DIAGNOSIS — C50511 Malignant neoplasm of lower-outer quadrant of right female breast: Secondary | ICD-10-CM

## 2020-06-08 DIAGNOSIS — Z780 Asymptomatic menopausal state: Secondary | ICD-10-CM | POA: Diagnosis not present

## 2020-08-10 DIAGNOSIS — C50919 Malignant neoplasm of unspecified site of unspecified female breast: Secondary | ICD-10-CM | POA: Diagnosis not present

## 2020-08-10 DIAGNOSIS — I10 Essential (primary) hypertension: Secondary | ICD-10-CM | POA: Diagnosis not present

## 2020-08-10 DIAGNOSIS — R0602 Shortness of breath: Secondary | ICD-10-CM | POA: Diagnosis not present

## 2020-08-10 DIAGNOSIS — Z0000 Encounter for general adult medical examination without abnormal findings: Secondary | ICD-10-CM | POA: Diagnosis not present

## 2020-08-10 DIAGNOSIS — E782 Mixed hyperlipidemia: Secondary | ICD-10-CM | POA: Diagnosis not present

## 2020-08-10 DIAGNOSIS — K589 Irritable bowel syndrome without diarrhea: Secondary | ICD-10-CM | POA: Diagnosis not present

## 2020-08-12 ENCOUNTER — Other Ambulatory Visit: Payer: Self-pay | Admitting: Oncology

## 2020-08-12 NOTE — Progress Notes (Signed)
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  INTERVAL HISTORY: Patient returns to clinic today for further evaluation and to assess her toleration of letrozole.  She has had significant hot flashes since initiating treatment that disrupt her day-to-day activity as well as sleep.  She otherwise feels well.  She has no neurologic complaints.  She denies any recent fevers or illnesses.  She has a good appetite and denies weight loss.  She has no chest pain, shortness of breath, cough, or hemoptysis.  She denies any nausea, vomiting, constipation, or diarrhea.  She has no urinary complaints.  Patient offers no further specific complaints today.  REVIEW OF SYSTEMS:   Review of Systems  Constitutional: Negative.  Negative for fever, malaise/fatigue and weight loss.  Respiratory: Negative.  Negative for cough, hemoptysis and shortness of breath.   Cardiovascular: Negative.  Negative for chest pain and leg swelling.  Gastrointestinal: Negative.  Negative for abdominal pain.  Genitourinary: Negative.  Negative for dysuria.  Musculoskeletal: Negative.  Negative for back pain.  Skin: Negative.  Negative for rash.  Neurological:  Positive for sensory change. Negative for dizziness, focal weakness, weakness and headaches.  Psychiatric/Behavioral: Negative.  The patient is not nervous/anxious.    As per HPI. Otherwise, a complete review of systems is negative.  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   Family history of adverse reaction to anesthesia    brother - PONV   Fibrocystic breast    GERD (gastroesophageal reflux disease)    History of hiatal hernia 04/14/2018   HTN (hypertension)    Hyperlipidemia    IBS (irritable bowel syndrome)    Personal history of radiation therapy    PONV (postoperative nausea and vomiting)    Precordial pain    Recurrent breast cancer, right ([place]) 04/2020   ER/PR (+), HER2/neu equivocal invasive mammary carcinoma   Vertigo    (x1) over 10 yrs ago    PAST SURGICAL HISTORY: Past Surgical History:  Procedure Laterality Date   ABDOMINAL HYSTERECTOMY     age 46   BREAST BIOPSY Right 2003   +   BREAST BIOPSY Right 04/13/2020   calcs, Affirm Bx-IMC/DCIS   BREAST LUMPECTOMY Right    f/u radiation    CATARACT EXTRACTION W/PHACO Right 08/06/2018   Procedure: CATARACT EXTRACTION PHACO AND INTRAOCULAR LENS PLACEMENT ([place])  RIGHT;  Surgeon: Leandrew Koyanagi, MD;  Location: [place];  Service: Ophthalmology;  Laterality: Right;   CATARACT EXTRACTION W/PHACO Left 08/27/2018   Procedure: CATARACT EXTRACTION PHACO AND INTRAOCULAR LENS PLACEMENT ([place])  LEFT;  Surgeon: Leandrew Koyanagi, MD;  Location: [place];  Service: Ophthalmology;  Laterality: Left;   COLONOSCOPY WITH PROPOFOL N/A 04/14/2018   Procedure: COLONOSCOPY WITH PROPOFOL;  Surgeon: Lollie Sails, MD;  Location: [facility] ENDOSCOPY;  Service: Endoscopy;  Laterality: N/A;   ESOPHAGOGASTRODUODENOSCOPY (EGD) WITH PROPOFOL N/A 04/14/2018   Procedure: ESOPHAGOGASTRODUODENOSCOPY (EGD) WITH PROPOFOL;  Surgeon: Gustavo Lah,  Billie Ruddy, MD;  Location: [facility] ENDOSCOPY;  Service: Endoscopy;  Laterality: N/A;   EYE SURGERY     PARTIAL MASTECTOMY WITH AXILLARY SENTINEL LYMPH NODE BIOPSY Right 05/04/2020   Procedure: TOTAL  MASTECTOMY WITH AXILLARY SENTINEL LYMPH NODE BIOPSY;  Surgeon: Herbert Pun, MD;   Location: [facility] ORS;  Service: General;  Laterality: Right;    FAMILY HISTORY: Family History  Problem Relation Age of Onset   Breast cancer Neg Hx     ADVANCED DIRECTIVES (Y/N):  N  HEALTH MAINTENANCE: Social History   Tobacco Use   Smoking status: Never   Smokeless tobacco: Never  Vaping Use   Vaping Use: Never used  Substance Use Topics   Alcohol use: Not Currently   Drug use: Never     Colonoscopy:  PAP:  Bone density:  Lipid panel:  Allergies  Allergen Reactions   Azithromycin Nausea Only   Cefuroxime Axetil Nausea And Vomiting   Doxycycline Nausea Only    Thrush    Levaquin [Levofloxacin] Nausea Only   Lisinopril     Hypotension    Other Hives    Pneumovac    Penicillin V Potassium Swelling    Childhood reaction - arm swelling   Sulfa Antibiotics Nausea And Vomiting   Naproxen Rash    Current Outpatient Medications  Medication Sig Dispense Refill   acetaminophen (TYLENOL) 325 MG tablet Take 650 mg by mouth every 6 (six) hours as needed.     amLODipine (NORVASC) 10 MG tablet Take 10 mg by mouth daily.     Calcium Carb-Cholecalciferol (CALCIUM 500 + D PO) Take 1 tablet by mouth daily.     Carboxymethylcellul-Glycerin (REFRESH RELIEVA OP) Place 1 drop into both eyes 2 (two) times daily as needed (dry eyes).     Cholecalciferol 25 MCG (1000 UT) TBDP Take 1,000 Units by mouth daily.     exemestane (AROMASIN) 25 MG tablet Take 1 tablet (25 mg total) by mouth daily after breakfast. 90 tablet 3   fluocinonide (LIDEX) 0.05 % external solution Apply 1 application topically at bedtime as needed (scalp dryness). 60 mL 3   hydrochlorothiazide (HYDRODIURIL) 12.5 MG tablet Take 12.5 mg by mouth daily.     lidocaine-prilocaine (EMLA) cream Apply topically.     losartan (COZAAR) 50 MG tablet Take 50 mg by mouth daily.     Multiple Vitamins-Minerals (CENTRUM SILVER PO) Take 1 tablet by mouth daily.     Multiple Vitamins-Minerals (PRESERVISION AREDS 2) CAPS Take 1 tablet  by mouth 2 (two) times daily.     pantoprazole (PROTONIX) 40 MG tablet Take 40 mg by mouth daily.     simvastatin (ZOCOR) 20 MG tablet Take 20 mg by mouth daily.     sodium chloride (OCEAN) 0.65 % nasal spray Place 1 spray into the nose 2 (two) times daily as needed for congestion.     aspirin 81 MG chewable tablet Chew 81 mg by mouth daily. (Patient not taking: No sig reported)     cetirizine (ZYRTEC) 10 MG tablet Take 10 mg by mouth daily as needed for allergies. (Patient not taking: No sig reported)     guaiFENesin (MUCINEX) 600 MG 12 hr tablet Take 600 mg by mouth 2 (two) times daily as needed for to loosen phlegm. (Patient not taking: No sig reported)     HYDROcodone-acetaminophen (NORCO/VICODIN) 5-325 MG tablet Take 1 tablet by mouth every 4 (four) hours as needed for moderate pain. (Patient not taking: No sig reported) 16 tablet 0  meclizine (ANTIVERT) 25 MG tablet Take 25 mg by mouth 3 (three) times daily as needed for dizziness. (Patient not taking: No sig reported)     No current facility-administered medications for this visit.    OBJECTIVE: Vitals:   08/18/20 1029  BP: 140/74  Pulse: 73  Resp: 20  Temp: 98.4 F (36.9 C)  SpO2: 93%     Body mass index is 26.41 kg/m.    ECOG FS:0 - Asymptomatic  General: Well-developed, well-nourished, no acute distress. Eyes: Pink conjunctiva, anicteric sclera. HEENT: Normocephalic, moist mucous membranes. Lungs: No audible wheezing or coughing. Heart: Regular rate and rhythm. Abdomen: Soft, nontender, no obvious distention. Musculoskeletal: No edema, cyanosis, or clubbing. Neuro: Alert, answering all questions appropriately. Cranial nerves grossly intact. Skin: No rashes or petechiae noted. Psych: Normal affect.   LAB RESULTS:  Lab Results  Component Value Date   NA 143 03/25/2009   K 3.9 03/25/2009   CL 102 03/25/2009   CO2 26 03/25/2009   GLUCOSE 106 (H) 03/25/2009   BUN 18 03/25/2009   CREATININE 1.18 03/25/2009    CALCIUM 9.3 03/25/2009   PROT 6.6 03/25/2009   ALBUMIN 4.4 03/25/2009   AST 17 03/25/2009   ALT 15 03/25/2009   ALKPHOS 48 03/25/2009   BILITOT 0.7 03/25/2009    Lab Results  Component Value Date   WBC 5.8 03/25/2009   NEUTROABS 3.5 03/25/2009   HGB 14.1 03/25/2009   HCT 41.1 03/25/2009   MCV 91.9 03/25/2009   PLT 194 03/25/2009     STUDIES: No results found.  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  PLAN:  1. 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  This was a stage Ia (T1c, N0, M0) grade 1 ER/PR positive, HER-2 negative malignancy.  Patient underwent lumpectomy with adjuvant XRT at that time.  She reports several years of tamoxifen and then was transitioned to Aromasin for a total of 5 years of treatment. This is unlikely a recurrence but rather a second primary.  She underwent right mastectomy on May 04, 2020.  Given the small size of her malignancy she does not require Oncotype testing and chemotherapy was not necessary.  She does not need adjuvant XRT.  Letrozole was discontinued secondary to side effects and patient was placed back on Aromasin. She will take for 5 years completing treatment in March 2027.   2.  Genetics: Referral has been made previously. 3.  Osteopenia: Patient underwent bone mineral density on June 08, 2020 Reported T score of -1.5.  Continue calcium and vitamin D supplementation.  Repeat in March 2023.   I spent a total of 30 minutes reviewing chart data, face-to-face evaluation with the patient, counseling and coordination of care as detailed above.    Patient expressed understanding and was in agreement with this plan. She also understands that She can call clinic at any time with any questions, concerns, or complaints.    Cancer Staging Primary cancer of lower-outer quadrant of right breast [facility]) Staging form: Breast, AJCC 8th Edition - Pathologic stage from 05/20/2020: Stage IA (pT1a, pN0, cM0, G1, ER+, PR+, HER2-) - Signed by Lloyd Huger, MD on 05/20/2020 Stage prefix: Initial diagnosis Histologic grading system: 3 grade system   Lloyd Huger, MD   08/19/2020 1:24 PM

## 2020-08-18 ENCOUNTER — Encounter: Payer: Self-pay | Admitting: Oncology

## 2020-08-18 ENCOUNTER — Inpatient Hospital Stay: Payer: PPO | Attending: Oncology | Admitting: Oncology

## 2020-08-18 ENCOUNTER — Other Ambulatory Visit: Payer: Self-pay

## 2020-08-18 VITALS — BP 140/74 | HR 73 | Temp 98.4°F | Resp 20 | Wt 168.6 lb

## 2020-08-18 DIAGNOSIS — M8580 Other specified disorders of bone density and structure, unspecified site: Secondary | ICD-10-CM | POA: Diagnosis not present

## 2020-08-18 DIAGNOSIS — Z170 Estrogen receptor positive status [ER+]: Secondary | ICD-10-CM | POA: Diagnosis not present

## 2020-08-18 DIAGNOSIS — C50511 Malignant neoplasm of lower-outer quadrant of right female breast: Secondary | ICD-10-CM

## 2020-08-18 DIAGNOSIS — Z9011 Acquired absence of right breast and nipple: Secondary | ICD-10-CM | POA: Diagnosis not present

## 2020-08-18 MED ORDER — EXEMESTANE 25 MG PO TABS: 25.0000 mg | ORAL_TABLET | Freq: Every day | ORAL | 3 refills | Status: DC

## 2020-08-19 DIAGNOSIS — H6122 Impacted cerumen, left ear: Secondary | ICD-10-CM | POA: Diagnosis not present

## 2020-08-19 DIAGNOSIS — H60502 Unspecified acute noninfective otitis externa, left ear: Secondary | ICD-10-CM | POA: Diagnosis not present

## 2020-10-10 DIAGNOSIS — H353132 Nonexudative age-related macular degeneration, bilateral, intermediate dry stage: Secondary | ICD-10-CM | POA: Diagnosis not present

## 2020-10-29 DIAGNOSIS — R051 Acute cough: Secondary | ICD-10-CM | POA: Diagnosis not present

## 2020-10-29 DIAGNOSIS — J019 Acute sinusitis, unspecified: Secondary | ICD-10-CM | POA: Diagnosis not present

## 2020-11-19 NOTE — Progress Notes (Signed)
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  INTERVAL HISTORY: Patient returns to clinic today for routine evaluation.  Since discontinuing letrozole and initiating Aromasin, her hot flashes and other side effects have resolved and she feels back to her baseline.  She has mild fatigue, but otherwise feels well and remains active.  She has no neurologic complaints.  She denies any recent fevers or illnesses.  She has a good appetite and denies weight loss.  She has no chest pain, shortness of breath, cough, or hemoptysis.  She denies any nausea, vomiting, constipation, or diarrhea.  She has no urinary complaints.  Patient offers no further specific complaints today.  REVIEW OF SYSTEMS:   Review of Systems  Constitutional:  Positive for malaise/fatigue. Negative for fever and weight loss.  Respiratory: Negative.  Negative for cough, hemoptysis and shortness of breath.   Cardiovascular: Negative.  Negative for chest pain and leg swelling.  Gastrointestinal: Negative.  Negative for abdominal pain.  Genitourinary: Negative.  Negative for dysuria.  Musculoskeletal: Negative.  Negative for back pain.  Skin: Negative.  Negative for rash.  Neurological:  Negative for dizziness, sensory change, focal weakness, weakness and headaches.  Psychiatric/Behavioral: Negative.  The patient is not nervous/anxious.    As per HPI. Otherwise, a complete review of systems is negative.  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   Family history of adverse reaction to anesthesia    brother - PONV   Fibrocystic breast    GERD (gastroesophageal reflux disease)    History of hiatal hernia 04/14/2018   HTN (hypertension)    Hyperlipidemia    IBS (irritable bowel syndrome)    Personal history of radiation therapy    PONV (postoperative nausea and vomiting)    Precordial pain    Recurrent breast cancer, right ([place]) 04/2020   ER/PR (+), HER2/neu equivocal invasive mammary carcinoma   Vertigo    (x1) over 10 yrs ago    PAST SURGICAL HISTORY: Past Surgical History:  Procedure Laterality Date   ABDOMINAL HYSTERECTOMY     age 26   BREAST BIOPSY Right 2003   +   BREAST BIOPSY Right 04/13/2020   calcs, Affirm Bx-IMC/DCIS   BREAST LUMPECTOMY Right    f/u radiation    CATARACT EXTRACTION W/PHACO Right 08/06/2018   Procedure: CATARACT EXTRACTION PHACO AND INTRAOCULAR LENS PLACEMENT ([place])  RIGHT;  Surgeon: Leandrew Koyanagi, MD;  Location: [place];  Service: Ophthalmology;  Laterality: Right;   CATARACT EXTRACTION W/PHACO Left 08/27/2018   Procedure: CATARACT EXTRACTION PHACO AND INTRAOCULAR LENS PLACEMENT ([place])  LEFT;  Surgeon: Leandrew Koyanagi, MD;  Location: [place];  Service: Ophthalmology;  Laterality: Left;   COLONOSCOPY WITH PROPOFOL N/A 04/14/2018   Procedure: COLONOSCOPY WITH PROPOFOL;  Surgeon: Lollie Sails, MD;  Location: [facility] ENDOSCOPY;  Service: Endoscopy;  Laterality: N/A;   ESOPHAGOGASTRODUODENOSCOPY (EGD) WITH PROPOFOL N/A 04/14/2018   Procedure: ESOPHAGOGASTRODUODENOSCOPY (EGD) WITH PROPOFOL;  Surgeon: Lollie Sails, MD;  Location: [facility]COPY;  Service: Endoscopy;  Laterality: N/A;   EYE SURGERY     PARTIAL MASTECTOMY WITH AXILLARY SENTINEL LYMPH NODE BIOPSY Right 05/04/2020   Procedure: TOTAL  MASTECTOMY WITH AXILLARY SENTINEL LYMPH NODE BIOPSY;  Surgeon: Herbert Pun, MD;  Location: [facility];  Service: General;  Laterality: Right;    FAMILY HISTORY: Family History  Problem Relation Age of Onset   Breast cancer Neg Hx     ADVANCED DIRECTIVES (Y/N):  N  HEALTH MAINTENANCE: Social History   Tobacco Use   Smoking status: Never   Smokeless tobacco: Never  Vaping Use   Vaping Use: Never used  Substance Use Topics   Alcohol use: Not Currently   Drug use: Never     Colonoscopy:  PAP:  Bone density:  Lipid panel:  Allergies  Allergen Reactions   Azithromycin Nausea Only   Cefuroxime Axetil Nausea And Vomiting   Doxycycline Nausea Only    Thrush    Levaquin [Levofloxacin] Nausea Only   Lisinopril     Hypotension    Other Hives    Pneumovac    Penicillin V Potassium Swelling    Childhood reaction - arm swelling   Sulfa Antibiotics Nausea And Vomiting   Naproxen Rash    Current Outpatient Medications  Medication Sig Dispense Refill   acetaminophen (TYLENOL) 325 MG tablet Take 650 mg by mouth every 6 (six) hours as needed.     amLODipine (NORVASC) 10 MG tablet Take 10 mg by mouth daily.     Calcium Carb-Cholecalciferol (CALCIUM 500 + D PO) Take 1 tablet by mouth daily.     Carboxymethylcellul-Glycerin (REFRESH RELIEVA OP) Place 1 drop into both eyes 2 (two) times daily as needed (dry eyes).     Cholecalciferol 25 MCG (1000 UT) TBDP Take 1,000 Units by mouth daily.     exemestane (AROMASIN) 25 MG tablet Take 1 tablet (25 mg total) by mouth daily after breakfast. 90 tablet 3   fluocinonide (LIDEX) 0.05 % external solution Apply 1 application topically at bedtime as needed (scalp dryness). 60 mL 3   hydrochlorothiazide (HYDRODIURIL) 12.5 MG tablet Take 12.5 mg by mouth daily.     lidocaine-prilocaine (EMLA) cream Apply topically.     losartan (COZAAR) 50 MG tablet Take 50 mg by mouth daily.     Multiple Vitamins-Minerals (CENTRUM SILVER PO) Take 1 tablet by mouth daily.     Multiple Vitamins-Minerals (PRESERVISION AREDS 2) CAPS  Take 1 tablet by mouth 2 (two) times daily.     pantoprazole (PROTONIX) 40 MG tablet Take 40 mg by mouth daily.     simvastatin (ZOCOR) 20 MG tablet Take 20 mg by mouth daily.     sodium chloride (OCEAN) 0.65 % nasal spray Place 1 spray into the nose 2 (two) times daily as needed for congestion.     aspirin 81 MG chewable tablet Chew 81 mg by mouth daily. (Patient not taking: No sig reported)     cetirizine (ZYRTEC) 10 MG tablet Take 10 mg by mouth daily as needed for allergies. (Patient not taking: No sig reported)     guaiFENesin (MUCINEX) 600 MG 12 hr tablet Take 600 mg by mouth 2 (two) times daily as needed for to loosen phlegm. (Patient not taking: No sig reported)     HYDROcodone-acetaminophen (NORCO/VICODIN) 5-325 MG tablet Take 1 tablet by mouth every 4 (four) hours as needed for moderate pain. (Patient not taking: No sig reported) 16 tablet  0   meclizine (ANTIVERT) 25 MG tablet Take 25 mg by mouth 3 (three) times daily as needed for dizziness. (Patient not taking: No sig reported)     No current facility-administered medications for this visit.    OBJECTIVE: Vitals:   11/22/20 1035  BP: (!) 141/56  Pulse: (!) 58  Resp: 16  Temp: (!) 97.3 F (36.3 C)     Body mass index is 26.67 kg/m.    ECOG FS:0 - Asymptomatic  General: Well-developed, well-nourished, no acute distress. Eyes: Pink conjunctiva, anicteric sclera. HEENT: Normocephalic, moist mucous membranes. Breast: Right mastectomy.  Exam deferred today. Lungs: No audible wheezing or coughing. Heart: Regular rate and rhythm. Abdomen: Soft, nontender, no obvious distention. Musculoskeletal: No edema, cyanosis, or clubbing. Neuro: Alert, answering all questions appropriately. Cranial nerves grossly intact. Skin: No rashes or petechiae noted. Psych: Normal affect.   LAB RESULTS:  Lab Results  Component Value Date   NA 143 03/25/2009   K 3.9 03/25/2009   CL 102 03/25/2009   CO2 26 03/25/2009   GLUCOSE 106 (H)  03/25/2009   BUN 18 03/25/2009   CREATININE 1.18 03/25/2009   CALCIUM 9.3 03/25/2009   PROT 6.6 03/25/2009   ALBUMIN 4.4 03/25/2009   AST 17 03/25/2009   ALT 15 03/25/2009   ALKPHOS 48 03/25/2009   BILITOT 0.7 03/25/2009    Lab Results  Component Value Date   WBC 5.8 03/25/2009   NEUTROABS 3.5 03/25/2009   HGB 14.1 03/25/2009   HCT 41.1 03/25/2009   MCV 91.9 03/25/2009   PLT 194 03/25/2009     STUDIES: No results found.  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  PLAN:  1. 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  This was a stage Ia (T1c, N0, M0) grade 1 ER/PR positive, HER-2 negative malignancy.  Patient underwent lumpectomy with adjuvant XRT at that time.  She reports several years of tamoxifen and then was transitioned to Aromasin for a total of 5 years of treatment. This is unlikely a recurrence but rather a second primary.  She underwent right mastectomy on May 04, 2020.  Given the small size of her malignancy she did not require Oncotype testing and chemotherapy was not necessary.  She does not need adjuvant XRT.  Letrozole was discontinued secondary to side effects and patient was placed back on Aromasin.  Continue total 5 years of treatment completing in March 2027.  Patient will require left screening mammogram in March 2023.  Return to clinic in 6 months after her mammogram for further evaluation. 2.  Genetics: Referral has been made previously. 3.  Osteopenia: Patient underwent bone mineral density on June 08, 2020 Reported T score of -1.5.  Continue calcium and vitamin D supplementation.  Repeat in March 2023 along with left screening mammogram.   Patient expressed understanding and was in agreement with this plan. She also understands that She  can call clinic at any time with any questions, concerns, or complaints.   Cancer Staging Primary cancer of lower-outer quadrant of right breast [facility]) Staging form: Breast, AJCC 8th Edition - Pathologic stage from 05/20/2020: Stage IA (pT1a, pN0, cM0, G1, ER+, PR+, HER2-) - Signed by Lloyd Huger, MD on 05/20/2020 Stage prefix: Initial diagnosis Histologic grading system: 3 grade system   Lloyd Huger, MD  11/22/2020 1:21 PM

## 2020-11-22 ENCOUNTER — Encounter: Payer: Self-pay | Admitting: Oncology

## 2020-11-22 ENCOUNTER — Inpatient Hospital Stay: Payer: PPO | Attending: Oncology | Admitting: Oncology

## 2020-11-22 VITALS — BP 141/56 | HR 58 | Temp 97.3°F | Resp 16 | Wt 170.3 lb

## 2020-11-22 DIAGNOSIS — Z170 Estrogen receptor positive status [ER+]: Secondary | ICD-10-CM | POA: Insufficient documentation

## 2020-11-22 DIAGNOSIS — M8580 Other specified disorders of bone density and structure, unspecified site: Secondary | ICD-10-CM | POA: Insufficient documentation

## 2020-11-22 DIAGNOSIS — Z853 Personal history of malignant neoplasm of breast: Secondary | ICD-10-CM | POA: Diagnosis not present

## 2020-11-22 DIAGNOSIS — C50511 Malignant neoplasm of lower-outer quadrant of right female breast: Secondary | ICD-10-CM | POA: Insufficient documentation

## 2020-11-22 NOTE — Progress Notes (Signed)
Patient is tolerating Aromasin but does have a high deductible of $200 every 3 months.

## 2020-12-06 ENCOUNTER — Other Ambulatory Visit: Payer: Self-pay

## 2020-12-06 ENCOUNTER — Ambulatory Visit: Payer: PPO | Admitting: Dermatology

## 2020-12-06 DIAGNOSIS — L821 Other seborrheic keratosis: Secondary | ICD-10-CM | POA: Diagnosis not present

## 2020-12-06 DIAGNOSIS — L409 Psoriasis, unspecified: Secondary | ICD-10-CM | POA: Diagnosis not present

## 2020-12-06 DIAGNOSIS — R220 Localized swelling, mass and lump, head: Secondary | ICD-10-CM

## 2020-12-06 DIAGNOSIS — D2239 Melanocytic nevi of other parts of face: Secondary | ICD-10-CM

## 2020-12-06 DIAGNOSIS — L578 Other skin changes due to chronic exposure to nonionizing radiation: Secondary | ICD-10-CM | POA: Diagnosis not present

## 2020-12-06 DIAGNOSIS — Z1283 Encounter for screening for malignant neoplasm of skin: Secondary | ICD-10-CM

## 2020-12-06 DIAGNOSIS — L738 Other specified follicular disorders: Secondary | ICD-10-CM

## 2020-12-06 DIAGNOSIS — D229 Melanocytic nevi, unspecified: Secondary | ICD-10-CM | POA: Diagnosis not present

## 2020-12-06 DIAGNOSIS — L814 Other melanin hyperpigmentation: Secondary | ICD-10-CM

## 2020-12-06 DIAGNOSIS — Z85828 Personal history of other malignant neoplasm of skin: Secondary | ICD-10-CM | POA: Diagnosis not present

## 2020-12-06 DIAGNOSIS — L720 Epidermal cyst: Secondary | ICD-10-CM | POA: Diagnosis not present

## 2020-12-06 DIAGNOSIS — L820 Inflamed seborrheic keratosis: Secondary | ICD-10-CM

## 2020-12-06 DIAGNOSIS — D1800 Hemangioma unspecified site: Secondary | ICD-10-CM

## 2020-12-06 DIAGNOSIS — Z872 Personal history of diseases of the skin and subcutaneous tissue: Secondary | ICD-10-CM

## 2020-12-06 MED ORDER — FLUOCINONIDE 0.05 % EX SOLN: 1.0000 "application " | Freq: Every evening | CUTANEOUS | 3 refills | Status: DC | PRN

## 2020-12-06 NOTE — Progress Notes (Signed)
Follow-Up Visit   Subjective  Cheryl Morris is a 80 y.o. female who presents for the following: Annual Exam (Patient here for full body skin exam and skin cancer screening. Patient does have a hx of BCC. Patient noticed a spot at left bottom lip a few months ago, asymptomatic. ) and Follow-up (Rechecking AK that was treated with LN2 at left upper forehead 6 months ago. Patient also using fluocinonide solution and T-Sal shampoo for psoriasis vs seborrheic dermatitis at the scalp. ). She says it helps with itch when she uses it.  She also has a few itchy growths that she picks at to check.   The following portions of the chart were reviewed this encounter and updated as appropriate:       Review of Systems:  No other skin or systemic complaints except as noted in HPI or Assessment and Plan.  Objective  Well appearing patient in no apparent distress; mood and affect are within normal limits.  A full examination was performed including scalp, head, eyes, ears, nose, lips, neck, chest, axillae, abdomen, back, buttocks, bilateral upper extremities, bilateral lower extremities, hands, feet, fingers, toes, fingernails, and toenails. All findings within normal limits unless otherwise noted below.  Scalp Pink scaly plaque at occipital scalp  right anterior jaw 4.13mm flesh papule  right jaw at mandible Clear today  Right Medial Thigh, left lower vermillion edge 0.3cm firm white papule at left lower vermilion edge Firm blue gray papule of the right upper medial thigh  Right Frontal Scalp Firm bony nodule about 3 cm.  Present since patient was 80 years old, no change  Left Shoulder x 1, superior gluteal cleft x 1, spinal lower back x 1 (3) Erythematous keratotic or waxy stuck-on papule.    Assessment & Plan  Psoriasis Scalp  Psoriasis is a chronic non-curable, but treatable genetic/hereditary disease that may have other systemic features affecting other organ systems such as  joints (Psoriatic Arthritis). It is associated with an increased risk of inflammatory bowel disease, heart disease, non-alcoholic fatty liver disease, and depression.    Vs Seborrheic Dermatitis  Continue T-Sal shampoo Continue fluocinonide solution 1-2 times daily until itchy rash cleared and prn flares.  Related Medications fluocinonide (LIDEX) 0.05 % external solution Apply 1 application topically at bedtime as needed (scalp dryness).  Nevus right anterior jaw  Stable  Benign-appearing.  Observation.  Call clinic for new or changing lesions.  Recommend daily use of broad spectrum spf 30+ sunscreen to sun-exposed areas.    Sebaceous hyperplasia right jaw at mandible  Biopsy proven (11/2018) Sebaceous Neoplasm with Atypical Features  No recurrence. Observation  Epidermal inclusion cyst (2) left lower vermillion edge; Right Medial Thigh  Benign-appearing. Exam most consistent with an epidermal inclusion cyst. Discussed that a cyst is a benign growth that can grow over time and sometimes get irritated or inflamed. Recommend observation if it is not bothersome. Discussed option of surgical excision to remove it if it is growing, symptomatic, or other changes noted. Please call for new or changing lesions so they can be evaluated.     Nodule of skin of head Right Frontal Scalp  Benign-appearing.  Stable. Observation.  Call clinic for new or changing lesions.  Recommend daily use of broad spectrum spf 30+ sunscreen to sun-exposed areas.    Inflamed seborrheic keratosis Left Shoulder x 1, superior gluteal cleft x 1, spinal lower back x 1  Destruction of lesion - Left Shoulder x 1, superior gluteal cleft x 1,  spinal lower back x 1  Destruction method: cryotherapy   Informed consent: discussed and consent obtained   Lesion destroyed using liquid nitrogen: Yes   Region frozen until ice ball extended beyond lesion: Yes   Outcome: patient tolerated procedure well with no  complications   Post-procedure details: wound care instructions given   Additional details:  Prior to procedure, discussed risks of blister formation, small wound, skin dyspigmentation, or rare scar following cryotherapy. Recommend Vaseline ointment to treated areas while healing.   Lentigines - Scattered tan macules - Due to sun exposure - Benign-appearing, observe - Recommend daily broad spectrum sunscreen SPF 30+ to sun-exposed areas, reapply every 2 hours as needed. - Call for any changes  Seborrheic Keratoses - Stuck-on, waxy, tan-brown papules and/or plaques  - Benign-appearing - Discussed benign etiology and prognosis. - Observe - Call for any changes  Melanocytic Nevi - Tan-brown and/or pink-flesh-colored symmetric macules and papules - Benign appearing on exam today - Observation - Call clinic for new or changing moles - Recommend daily use of broad spectrum spf 30+ sunscreen to sun-exposed areas.   Hemangiomas - Red papules - Discussed benign nature - Observe - Call for any changes  Actinic Damage - Chronic condition, secondary to cumulative UV/sun exposure - diffuse scaly erythematous macules with underlying dyspigmentation - Recommend daily broad spectrum sunscreen SPF 30+ to sun-exposed areas, reapply every 2 hours as needed.  - Staying in the shade or wearing long sleeves, sun glasses (UVA+UVB protection) and wide brim hats (4-inch brim around the entire circumference of the hat) are also recommended for sun protection.  - Call for new or changing lesions.  Skin cancer screening performed today.  History of Basal Cell Carcinoma of the Skin - No evidence of recurrence today at left nasal dorsum - Recommend regular full body skin exams - Recommend daily broad spectrum sunscreen SPF 30+ to sun-exposed areas, reapply every 2 hours as needed.  - Call if any new or changing lesions are noted between office visits  History of PreCancerous Actinic Keratosis  -  site(s) of PreCancerous Actinic Keratosis clear today. - these may recur and new lesions may form requiring treatment to prevent transformation into skin cancer - observe for new or changing spots and contact [place] for appointment if occur - photoprotection with sun protective clothing; sunglasses and broad spectrum sunscreen with SPF of at least 30 + and frequent self skin exams recommended - yearly exams by a dermatologist recommended for persons with history of PreCancerous Actinic Keratoses  Milia - tiny firm white papules - type of cyst - benign - may be extracted if symptomatic - observe  Return in about 6 months (around 06/05/2021) for sun exposed areas, scalp psoriasis.  Graciella Belton, RMA, am acting as scribe for Brendolyn Patty, MD .  Documentation: I have reviewed the above documentation for accuracy and completeness, and I agree with the above.  Brendolyn Patty MD

## 2020-12-06 NOTE — Patient Instructions (Addendum)

## 2020-12-07 DIAGNOSIS — I10 Essential (primary) hypertension: Secondary | ICD-10-CM | POA: Diagnosis not present

## 2020-12-14 DIAGNOSIS — K589 Irritable bowel syndrome without diarrhea: Secondary | ICD-10-CM | POA: Diagnosis not present

## 2020-12-14 DIAGNOSIS — Z23 Encounter for immunization: Secondary | ICD-10-CM | POA: Diagnosis not present

## 2020-12-14 DIAGNOSIS — E782 Mixed hyperlipidemia: Secondary | ICD-10-CM | POA: Diagnosis not present

## 2020-12-14 DIAGNOSIS — C50919 Malignant neoplasm of unspecified site of unspecified female breast: Secondary | ICD-10-CM | POA: Diagnosis not present

## 2020-12-14 DIAGNOSIS — I10 Essential (primary) hypertension: Secondary | ICD-10-CM | POA: Diagnosis not present

## 2020-12-14 DIAGNOSIS — Z0001 Encounter for general adult medical examination with abnormal findings: Secondary | ICD-10-CM | POA: Diagnosis not present

## 2021-02-17 ENCOUNTER — Other Ambulatory Visit: Payer: Self-pay | Admitting: General Surgery

## 2021-02-17 DIAGNOSIS — Z853 Personal history of malignant neoplasm of breast: Secondary | ICD-10-CM

## 2021-02-17 DIAGNOSIS — Z1231 Encounter for screening mammogram for malignant neoplasm of breast: Secondary | ICD-10-CM

## 2021-02-21 DIAGNOSIS — R1011 Right upper quadrant pain: Secondary | ICD-10-CM | POA: Diagnosis not present

## 2021-02-21 DIAGNOSIS — I10 Essential (primary) hypertension: Secondary | ICD-10-CM | POA: Diagnosis not present

## 2021-02-23 DIAGNOSIS — I10 Essential (primary) hypertension: Secondary | ICD-10-CM | POA: Diagnosis not present

## 2021-02-23 DIAGNOSIS — R079 Chest pain, unspecified: Secondary | ICD-10-CM | POA: Diagnosis not present

## 2021-02-23 DIAGNOSIS — E782 Mixed hyperlipidemia: Secondary | ICD-10-CM | POA: Diagnosis not present

## 2021-02-23 DIAGNOSIS — R0602 Shortness of breath: Secondary | ICD-10-CM | POA: Diagnosis not present

## 2021-03-16 DIAGNOSIS — R0602 Shortness of breath: Secondary | ICD-10-CM | POA: Diagnosis not present

## 2021-03-16 DIAGNOSIS — R079 Chest pain, unspecified: Secondary | ICD-10-CM | POA: Diagnosis not present

## 2021-03-28 ENCOUNTER — Other Ambulatory Visit: Payer: Self-pay

## 2021-03-28 ENCOUNTER — Ambulatory Visit
Admission: RE | Admit: 2021-03-28 | Discharge: 2021-03-28 | Disposition: A | Discharge status: Home / Self Care | Payer: PPO | Source: Ambulatory Visit | Attending: Oncology | Admitting: Oncology

## 2021-03-28 DIAGNOSIS — Z1231 Encounter for screening mammogram for malignant neoplasm of breast: Secondary | ICD-10-CM | POA: Diagnosis not present

## 2021-03-28 DIAGNOSIS — C50511 Malignant neoplasm of lower-outer quadrant of right female breast: Secondary | ICD-10-CM | POA: Insufficient documentation

## 2021-03-30 DIAGNOSIS — I10 Essential (primary) hypertension: Secondary | ICD-10-CM | POA: Diagnosis not present

## 2021-03-30 DIAGNOSIS — E782 Mixed hyperlipidemia: Secondary | ICD-10-CM | POA: Diagnosis not present

## 2021-04-04 DIAGNOSIS — C50511 Malignant neoplasm of lower-outer quadrant of right female breast: Secondary | ICD-10-CM | POA: Diagnosis not present

## 2021-04-12 DIAGNOSIS — I10 Essential (primary) hypertension: Secondary | ICD-10-CM | POA: Diagnosis not present

## 2021-04-18 DIAGNOSIS — H353132 Nonexudative age-related macular degeneration, bilateral, intermediate dry stage: Secondary | ICD-10-CM | POA: Diagnosis not present

## 2021-04-19 DIAGNOSIS — K589 Irritable bowel syndrome without diarrhea: Secondary | ICD-10-CM | POA: Diagnosis not present

## 2021-04-19 DIAGNOSIS — I10 Essential (primary) hypertension: Secondary | ICD-10-CM | POA: Diagnosis not present

## 2021-04-19 DIAGNOSIS — E782 Mixed hyperlipidemia: Secondary | ICD-10-CM | POA: Diagnosis not present

## 2021-04-19 DIAGNOSIS — Z0000 Encounter for general adult medical examination without abnormal findings: Secondary | ICD-10-CM | POA: Diagnosis not present

## 2021-05-13 ENCOUNTER — Other Ambulatory Visit: Payer: Self-pay | Admitting: Oncology

## 2021-05-18 DIAGNOSIS — Z124 Encounter for screening for malignant neoplasm of cervix: Secondary | ICD-10-CM | POA: Diagnosis not present

## 2021-06-12 ENCOUNTER — Ambulatory Visit
Admission: RE | Admit: 2021-06-12 | Discharge: 2021-06-12 | Disposition: A | Discharge status: Home / Self Care | Payer: PPO | Source: Ambulatory Visit | Attending: Oncology | Admitting: Oncology

## 2021-06-12 DIAGNOSIS — Z923 Personal history of irradiation: Secondary | ICD-10-CM | POA: Insufficient documentation

## 2021-06-12 DIAGNOSIS — Z13820 Encounter for screening for osteoporosis: Secondary | ICD-10-CM | POA: Insufficient documentation

## 2021-06-12 DIAGNOSIS — M85851 Other specified disorders of bone density and structure, right thigh: Secondary | ICD-10-CM | POA: Diagnosis not present

## 2021-06-12 DIAGNOSIS — C50511 Malignant neoplasm of lower-outer quadrant of right female breast: Secondary | ICD-10-CM | POA: Diagnosis not present

## 2021-06-12 DIAGNOSIS — Z780 Asymptomatic menopausal state: Secondary | ICD-10-CM | POA: Insufficient documentation

## 2021-06-14 ENCOUNTER — Ambulatory Visit: Payer: PPO | Admitting: Dermatology

## 2021-06-16 ENCOUNTER — Inpatient Hospital Stay: Payer: PPO | Attending: Oncology | Admitting: Medical Oncology

## 2021-06-16 ENCOUNTER — Encounter: Payer: Self-pay | Admitting: Medical Oncology

## 2021-06-16 VITALS — BP 132/63 | HR 71 | Temp 98.4°F | Resp 16 | Wt 169.4 lb

## 2021-06-16 DIAGNOSIS — C50511 Malignant neoplasm of lower-outer quadrant of right female breast: Secondary | ICD-10-CM | POA: Diagnosis not present

## 2021-06-16 DIAGNOSIS — M8580 Other specified disorders of bone density and structure, unspecified site: Secondary | ICD-10-CM | POA: Diagnosis not present

## 2021-06-16 DIAGNOSIS — Z79811 Long term (current) use of aromatase inhibitors: Secondary | ICD-10-CM

## 2021-06-16 DIAGNOSIS — C50919 Malignant neoplasm of unspecified site of unspecified female breast: Secondary | ICD-10-CM

## 2021-06-16 DIAGNOSIS — Z170 Estrogen receptor positive status [ER+]: Secondary | ICD-10-CM | POA: Insufficient documentation

## 2021-06-16 NOTE — Progress Notes (Signed)
?[place]  ?[phone] [phone] ? ?ID: Cheryl Morris OB: September 15, 1940  MR#: [id number]  EGB#:[id number] ? ?Patient Care Team: ?Baxter Hire, MD as PCP - General (Internal Medicine) ?Theodore Demark, RN as Oncology Nurse Navigator ? 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 ? ?INTERVAL HISTORY: Patient returns to clinic today for routine evaluation.  On Aromasin. Tolerating well but wants to ask today if this can cause hair thinning. Had DEXA in March 2023 and Mammogram in January 2023.  ? ?Feels well and remains active.  She has no neurologic complaints.  She denies any recent fevers or illnesses.  She has a good appetite and denies weight loss.  She has no chest pain, shortness of breath, cough, or hemoptysis.  She denies any nausea, vomiting, constipation, or diarrhea.  She has no urinary complaints.  Patient offers no further specific complaints today. ? ?REVIEW OF SYSTEMS:   ?Review of Systems  ?Constitutional:  Positive for malaise/fatigue. Negative for fever and weight loss.  ?Respiratory: Negative.  Negative for cough, hemoptysis and shortness of breath.   ?Cardiovascular: Negative.  Negative for chest pain and leg swelling.  ?Gastrointestinal: Negative.  Negative for abdominal pain.  ?Genitourinary: Negative.  Negative for dysuria.  ?Musculoskeletal: Negative.  Negative for back pain.  ?Skin: Negative.  Negative for rash.  ?Neurological:  Negative for dizziness, sensory change, focal weakness, weakness and headaches.  ?Psychiatric/Behavioral: Negative.  The patient is not nervous/anxious.   ? ?As per HPI. Otherwise, a complete review of systems is negative. ? ?PAST MEDICAL HISTORY: ?Past Medical History:  ?Diagnosis Date  ? Arthritis   ? Basal cell carcinoma 12/08/2019  ? L nasal dorsum, [facility]  ? Benign essential hypertension   ?  Bradycardia   ? 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 Family history of adverse reaction to anesthesia   ? brother - PONV  ? Fibrocystic breast   ? GERD (gastroesophageal reflux disease)   ? History of hiatal hernia 04/14/2018  ? HTN (hypertension)   ? Hyperlipidemia   ? IBS (irritable bowel syndrome)   ? Personal history of radiation therapy   ? PONV (postoperative nausea and vomiting)   ? Precordial pain   ? Recurrent breast cancer, right ([place]) 04/2020  ? ER/PR (+), HER2/neu equivocal invasive mammary carcinoma  ? Vertigo   ? (x1) over 10 yrs ago  ? ? ?PAST SURGICAL HISTORY: ?Past Surgical History:  ?Procedure Laterality Date  ? ABDOMINAL HYSTERECTOMY    ? age 81  ? BREAST BIOPSY Right 2003  ? +  ? BREAST BIOPSY Right 04/13/2020  ? calcs, Affirm Bx-IMC/DCIS  ? BREAST LUMPECTOMY Right   ? f/u radiation   ? CATARACT EXTRACTION W/PHACO Right 08/06/2018  ? Procedure: CATARACT EXTRACTION PHACO AND INTRAOCULAR LENS PLACEMENT ([place])  RIGHT;  Surgeon: Leandrew Koyanagi, MD;  Location: [place];  Service: Ophthalmology;  Laterality: Right;  ? CATARACT EXTRACTION W/PHACO Left 08/27/2018  ? Procedure: CATARACT EXTRACTION PHACO AND INTRAOCULAR LENS PLACEMENT ([place])  LEFT;  Surgeon: Leandrew Koyanagi, MD;  Location: [place];  Service: Ophthalmology;  Laterality: Left;  ? COLONOSCOPY WITH PROPOFOL N/A 04/14/2018  ? Procedure: COLONOSCOPY WITH PROPOFOL;  Surgeon: Lollie Sails, MD;  Location: [facility] ENDOSCOPY;  Service: Endoscopy;  Laterality: N/A;  ? ESOPHAGOGASTRODUODENOSCOPY (EGD) WITH PROPOFOL N/A 04/14/2018  ? Procedure: ESOPHAGOGASTRODUODENOSCOPY (EGD) WITH PROPOFOL;  Surgeon:  Lollie Sails, MD;  Location: [facility] ENDOSCOPY;  Service: Endoscopy;  Laterality: N/A;  ? EYE SURGERY    ? MASTECTOMY Right 05/04/2020  ? PARTIAL MASTECTOMY WITH AXILLARY SENTINEL LYMPH NODE BIOPSY Right 05/04/2020  ? Procedure: TOTAL  MASTECTOMY WITH AXILLARY SENTINEL LYMPH NODE BIOPSY;  Surgeon: Herbert Pun, MD;  Location: [facility] ORS;  Service: General;  Laterality: Right;  ? ? ?FAMILY HISTORY: ?Family History  ?Problem Relation Age of Onset  ? Breast cancer Neg Hx   ? ? ?ADVANCED DIRECTIVES (Y/N):  N ? ?HEALTH MAINTENANCE: ?Social History  ? ?Tobacco Use  ? Smoking status: Never  ? Smokeless tobacco: Never  ?Vaping Use  ? Vaping Use: Never used  ?Substance Use Topics  ? Alcohol use: Not Currently  ? Drug use: Never  ? ? ? Colonoscopy: ? PAP: ? Bone density: ? Lipid panel: ? ?Allergies  ?Allergen Reactions  ? Azithromycin Nausea Only  ? Cefuroxime Axetil Nausea And Vomiting  ? Doxycycline Nausea Only  ?  Thrush   ? Levaquin [Levofloxacin] Nausea Only  ? Lisinopril   ?  Hypotension   ? Other Hives  ?  Pneumovac   ? Penicillin V Potassium Swelling  ?  Childhood reaction - arm swelling  ? Sulfa Antibiotics Nausea And Vomiting  ? Naproxen Rash  ? ? ?Current Outpatient Medications  ?Medication Sig Dispense Refill  ? acetaminophen (TYLENOL) 325 MG tablet Take 650 mg by mouth every 6 (six) hours as needed.    ? amLODipine (NORVASC) 10 MG tablet Take 10 mg by mouth daily.    ? Calcium Carb-Cholecalciferol (CALCIUM 500 + D PO) Take 1 tablet by mouth daily.    ? Carboxymethylcellul-Glycerin (REFRESH RELIEVA OP) Place 1 drop into both eyes 2 (two) times daily as needed (dry eyes).    ? Cholecalciferol 25 MCG (1000 UT) TBDP Take 1,000 Units by mouth daily.    ? exemestane (AROMASIN) 25 MG tablet TAKE 1 TABLET BY MOUTH DAILY AFTER BREAKFAST 90 tablet 3  ? fluocinonide (LIDEX) 0.05 % external solution Apply 1 application topically at bedtime as needed (scalp dryness). 60 mL 3  ? guaiFENesin (MUCINEX) 600 MG 12 hr tablet Take 600 mg by mouth 2 (two) times daily as needed for to loosen phlegm.    ? hydrochlorothiazide (HYDRODIURIL) 12.5 MG tablet Take 12.5 mg by mouth daily.    ? losartan (COZAAR) 50 MG tablet Take 50 mg by mouth daily. 1.5 tab in morning    ? Multiple Vitamins-Minerals (CENTRUM SILVER PO) Take 1 tablet by mouth  daily.    ? Multiple Vitamins-Minerals (PRESERVISION AREDS 2) CAPS Take 1 tablet by mouth 2 (two) times daily.    ? pantoprazole (PROTONIX) 40 MG tablet Take 40 mg by mouth daily.    ? simvastatin (ZOCOR) 20 MG tablet Take 20 mg by mouth daily.    ? sodium chloride (OCEAN) 0.65 % nasal spray Place 1 spray into the nose 2 (two) times daily as needed for congestion.    ? aspirin 81 MG chewable tablet Chew 81 mg by mouth daily. (Patient not taking: Reported on 05/19/2020)    ? cetirizine (ZYRTEC) 10 MG tablet Take 10 mg by mouth daily as needed for allergies. (Patient not taking: Reported on 05/19/2020)    ? HYDROcodone-acetaminophen (NORCO/VICODIN) 5-325 MG tablet Take 1 tablet by mouth every 4 (four) hours as needed for moderate pain. (Patient not taking: Reported on 05/19/2020) 16 tablet 0  ? lidocaine-prilocaine (EMLA) cream Apply topically. (Patient not  taking: Reported on 06/16/2021)    ? meclizine (ANTIVERT) 25 MG tablet Take 25 mg by mouth 3 (three) times daily as needed for dizziness. (Patient not taking: Reported on 05/19/2020)    ? ?No current facility-administered medications for this visit.  ? ? ?OBJECTIVE: ?Vitals:  ? 06/16/21 1000  ?BP: 132/63  ?Pulse: 71  ?Resp: 16  ?Temp: 98.4 ?F (36.9 ?C)  ?   Body mass index is 26.53 kg/m?Marland Kitchen    ECOG FS:0 - Asymptomatic ? ?General: Well-developed, well-nourished, no acute distress. ?Eyes: Pink conjunctiva, anicteric sclera. ?HEENT: Normocephalic, moist mucous membranes. ?Breast: Right mastectomy.  Exam deferred today. ?Lungs: No audible wheezing or coughing. ?Heart: Regular rate and rhythm. ?Abdomen: Soft, nontender, no obvious distention. ?Musculoskeletal: No edema, cyanosis, or clubbing. ?Neuro: Alert, answering all questions appropriately. Cranial nerves grossly intact. ?Skin: No rashes or petechiae noted. ?Psych: Normal affect. ? ? ?LAB RESULTS: ? ?Lab Results  ?Component Value Date  ? NA 143 03/25/2009  ? K 3.9 03/25/2009  ? CL 102 03/25/2009  ? CO2 26 03/25/2009  ?  GLUCOSE 106 (H) 03/25/2009  ? BUN 18 03/25/2009  ? CREATININE 1.18 03/25/2009  ? CALCIUM 9.3 03/25/2009  ? PROT 6.6 03/25/2009  ? ALBUMIN 4.4 03/25/2009  ? AST 17 03/25/2009  ? ALT 15 03/25/2009  ? ALKPHOS

## 2021-06-16 NOTE — Progress Notes (Signed)
Patient thinks her hair is getting thinner and wondering if it could be a side effect of Aromasin.  Occasional hot flashes in the morning that lasts about 5 minutes. ?

## 2021-06-20 ENCOUNTER — Ambulatory Visit: Payer: PPO | Admitting: Dermatology

## 2021-06-27 ENCOUNTER — Ambulatory Visit: Payer: PPO | Admitting: Dermatology

## 2021-06-27 DIAGNOSIS — M898X9 Other specified disorders of bone, unspecified site: Secondary | ICD-10-CM

## 2021-06-27 DIAGNOSIS — D2239 Melanocytic nevi of other parts of face: Secondary | ICD-10-CM | POA: Diagnosis not present

## 2021-06-27 DIAGNOSIS — M898X8 Other specified disorders of bone, other site: Secondary | ICD-10-CM | POA: Diagnosis not present

## 2021-06-27 DIAGNOSIS — L814 Other melanin hyperpigmentation: Secondary | ICD-10-CM

## 2021-06-27 DIAGNOSIS — Z85828 Personal history of other malignant neoplasm of skin: Secondary | ICD-10-CM | POA: Diagnosis not present

## 2021-06-27 DIAGNOSIS — L720 Epidermal cyst: Secondary | ICD-10-CM

## 2021-06-27 DIAGNOSIS — D1800 Hemangioma unspecified site: Secondary | ICD-10-CM

## 2021-06-27 DIAGNOSIS — L409 Psoriasis, unspecified: Secondary | ICD-10-CM | POA: Diagnosis not present

## 2021-06-27 DIAGNOSIS — L738 Other specified follicular disorders: Secondary | ICD-10-CM

## 2021-06-27 DIAGNOSIS — L820 Inflamed seborrheic keratosis: Secondary | ICD-10-CM | POA: Diagnosis not present

## 2021-06-27 DIAGNOSIS — Z1283 Encounter for screening for malignant neoplasm of skin: Secondary | ICD-10-CM | POA: Diagnosis not present

## 2021-06-27 DIAGNOSIS — L578 Other skin changes due to chronic exposure to nonionizing radiation: Secondary | ICD-10-CM | POA: Diagnosis not present

## 2021-06-27 DIAGNOSIS — D229 Melanocytic nevi, unspecified: Secondary | ICD-10-CM

## 2021-06-27 DIAGNOSIS — L821 Other seborrheic keratosis: Secondary | ICD-10-CM | POA: Diagnosis not present

## 2021-06-27 MED ORDER — FLUOCINONIDE 0.05 % EX SOLN: 1.0000 "application " | Freq: Every evening | CUTANEOUS | 3 refills | Status: DC | PRN

## 2021-06-27 NOTE — Patient Instructions (Addendum)
For under left breast  use Zeasorb Af Powder found in antifungal section at [facility]   apply after shower to help control moisture.  ? ? ? ? ?Cryotherapy Aftercare ? ?Wash gently with soap and water everyday.   ?Apply Vaseline and Band-Aid daily until healed.  ? ? ?Seborrheic Keratosis ? ?What causes seborrheic keratoses? ?Seborrheic keratoses are harmless, common skin growths that first appear during adult life.  As time goes by, more growths appear.  Some people may develop a large number of them.  Seborrheic keratoses appear on both covered and uncovered body parts.  They are not caused by sunlight.  The tendency to develop seborrheic keratoses can be inherited.  They vary in color from skin-colored to gray, brown, or even black.  They can be either smooth or have a rough, warty surface.   ?Seborrheic keratoses are superficial and look as if they were stuck on the skin.  Under the microscope this type of keratosis looks like layers upon layers of skin.  That is why at times the top layer may seem to fall off, but the rest of the growth remains and re-grows.   ? ?Treatment ?Seborrheic keratoses do not need to be treated, but can easily be removed in the office.  Seborrheic keratoses often cause symptoms when they rub on clothing or jewelry.  Lesions can be in the way of shaving.  If they become inflamed, they can cause itching, soreness, or burning.  Removal of a seborrheic keratosis can be accomplished by freezing, burning, or surgery. ?If any spot bleeds, scabs, or grows rapidly, please return to have it checked, as these can be an indication of a skin cancer. ? ?Melanoma ABCDEs ? ?Melanoma is the most dangerous type of skin cancer, and is the leading cause of death from skin disease.  You are more likely to develop melanoma if you: ?Have light-colored skin, light-colored eyes, or red or blond hair ?Spend a lot of time in the sun ?Tan regularly, either outdoors or in a tanning bed ?Have had blistering sunburns,  especially during childhood ?Have a close family member who has had a melanoma ?Have atypical moles or large birthmarks ? ?Early detection of melanoma is key since treatment is typically straightforward and cure rates are extremely high if we catch it early.  ? ?The first sign of melanoma is often a change in a mole or a new dark spot.  The ABCDE system is a way of remembering the signs of melanoma. ? ?A for asymmetry:  The two halves do not match. ?B for border:  The edges of the growth are irregular. ?C for color:  A mixture of colors are present instead of an even brown color. ?D for diameter:  Melanomas are usually (but not always) greater than 26m - the size of a pencil eraser. ?E for evolution:  The spot keeps changing in size, shape, and color. ? ?Please check your skin once per month between visits. You can use a small mirror in front and a large mirror behind you to keep an eye on the back side or your body.  ? ?If you see any new or changing lesions before your next follow-up, please call to schedule a visit. ? ?Please continue daily skin protection including broad spectrum sunscreen SPF 30+ to sun-exposed areas, reapplying every 2 hours as needed when you're outdoors.  ? ?Staying in the shade or wearing long sleeves, sun glasses (UVA+UVB protection) and wide brim hats (4-inch brim around the entire  circumference of the hat) are also recommended for sun protection.   ? ?If You Need Anything After Your Visit ? ?If you have any questions or concerns for your doctor, please call our main line at [phone] and press option 4 to reach your doctor's medical assistant. If no one answers, please leave a voicemail as directed and we will return your call as soon as possible. Messages left after 4 pm will be answered the following business day.  ? ?You may also send [place] a message via MyChart. We typically respond to MyChart messages within 1-2 business days. ? ?For prescription refills, please ask your pharmacy to  contact our office. Our fax number is [phone]. ? ?If you have an urgent issue when the clinic is closed that cannot wait until the next business day, you can page your doctor at the number below.   ? ?Please note that while we do our best to be available for urgent issues outside of office hours, we are not available 24/7.  ? ?If you have an urgent issue and are unable to reach [place], you may choose to seek medical care at your doctor's office, retail clinic, urgent care center, or emergency room. ? ?If you have a medical emergency, please immediately call 911 or go to the emergency department. ? ?Pager Numbers ? ?- Dr. Nehemiah Massed: [phone] ? ?- Dr. Laurence Ferrari: [phone] ? ?- Dr. Nicole Kindred: [phone] ? ?In the event of inclement weather, please call our main line at [phone] for an update on the status of any delays or closures. ? ?Dermatology Medication Tips: ?Please keep the boxes that topical medications come in in order to help keep track of the instructions about where and how to use these. Pharmacies typically print the medication instructions only on the boxes and not directly on the medication tubes.  ? ?If your medication is too expensive, please contact our office at [phone] option 4 or send [place] a message through [place].  ? ?We are unable to tell what your co-pay for medications will be in advance as this is different depending on your insurance coverage. However, we may be able to find a substitute medication at lower cost or fill out paperwork to get insurance to cover a needed medication.  ? ?If a prior authorization is required to get your medication covered by your insurance company, please allow [place] 1-2 business days to complete this process. ? ?Drug prices often vary depending on where the prescription is filled and some pharmacies may offer cheaper prices. ? ?The website www.goodrx.com contains coupons for medications through different pharmacies. The prices here do not account for what  the cost may be with help from insurance (it may be cheaper with your insurance), but the website can give you the price if you did not use any insurance.  ?- You can print the associated coupon and take it with your prescription to the pharmacy.  ?- You may also stop by our office during regular business hours and pick up a GoodRx coupon card.  ?- If you need your prescription sent electronically to a different pharmacy, notify our office through [facility] or by phone at [phone] option 4. ? ? ? ? ?Si Usted Necesita Algo Despu?s de Su Visita ? ?Tambi?n puede enviarnos un mensaje a trav?s de MyChart. Por lo general respondemos a los mensajes de MyChart en el transcurso de 1 a 2 d?as h?biles. ? ?Para renovar recetas, por favor pida a su farmacia que se ponga en  contacto con nuestra oficina. Nuestro n?mero de fax es el [phone]. ? ?Si tiene un asunto urgente cuando la cl?nica est? cerrada y que no puede esperar hasta el siguiente d?a h?bil, puede llamar/localizar a su doctor(a) al n?mero que aparece a continuaci?n.  ? ?Por favor, tenga en cuenta que aunque hacemos todo lo posible para estar disponibles para asuntos urgentes fuera del horario de oficina, no estamos disponibles las 24 horas del d?a, los 7 d?as de la semana.  ? ?Si tiene un problema urgente y no puede comunicarse con nosotros, puede optar por buscar atenci?n m?dica  en el consultorio de su doctor(a), en una cl?nica privada, en un centro de atenci?n urgente o en una sala de emergencias. ? ?Si tiene Engineer, maintenance (IT) m?dica, por favor llame inmediatamente al 911 o vaya a la sala de emergencias. ? ?N?meros de b?per ? ?- Dr. Nehemiah Massed: [phone] ? ?- Dra. Moye: [phone] ? ?- Dra. Nicole Kindred: [phone] ? ?En caso de inclemencias del tiempo, por favor llame a nuestra l?nea principal al [phone] para una actualizaci?n sobre el estado de cualquier retraso o cierre. ? ?Consejos para la medicaci?n en dermatolog?a: ?Por favor, guarde las  cajas en las que vienen los medicamentos de uso t?pico para ayudarle a seguir las instrucciones sobre d?nde y c?mo usarlos. Las farmacias generalmente imprimen las instrucciones del medicamento s?lo en

## 2021-06-27 NOTE — Progress Notes (Signed)
? ?Follow-Up Visit ?  ?Subjective  ?Cheryl Morris is a 81 y.o. female who presents for the following: Follow-up (Patient has a few spots at forehead, right side of face, right side of neck, and under left breast. Patient also has  a cyst at right upper thigh and at left groin area she would like checked. /).  Several spots are irritated and changing. ? ?The patient has spots, moles and lesions to be evaluated, some may be new or changing and the patient has concerns that these could be cancer. ? ? ?The following portions of the chart were reviewed this encounter and updated as appropriate:   ?  ? ?Review of Systems: No other skin or systemic complaints except as noted in HPI or Assessment and Plan. ? ? ?Objective  ?Well appearing patient in no apparent distress; mood and affect are within normal limits. ? ?A focused examination was performed including face, scalp, back, b/l arms, right medial upper thigh, left labia majora, left thigh, left inframammary . Relevant physical exam findings are noted in the Assessment and Plan. ? ?Scalp ?Mild scaling occipital scalp ? ?right anterior jaw ?4.73m flesh papule with emerging hair ? ?right medial upper thigh ?Firm blue gray papule of the right upper medial thigh. ? ?left labia majora ?Small firm white papule  ? ?right sternal notch x 1, left thigh x 1, left crown x 1 (3) ?Erythematous stuck-on, waxy papule or plaque ? ?left upper forehead, nasal labial folds ?Smooth white papule(s).  ? ?Right Frontal Scalp ?Bony nodule  ? ? ?Assessment & Plan  ?Psoriasis ?Scalp ? 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 ?Psoriasis is a chronic non-curable, but treatable genetic/hereditary disease that may have other systemic features affecting other organ systems such as joints (Psoriatic Arthritis). It is associated with an increased risk of inflammatory bowel disease, heart disease, non-alcoholic fatty liver disease, and depression.   ?  ?Continue T-Sal shampoo ?Continue fluocinonide  solution 1-2 times daily until itchy rash cleared and prn flares. ? ? ?fluocinonide (LIDEX) 0.05 % external solution - Scalp ?Apply 1 application. topically at bedtime as needed (scalp dryness). ? ?Nevus ?right anterior jaw ? ?Benign-appearing.  Observation.  Call clinic for new or changing lesions.  Recommend daily use of broad spectrum spf 30+ sunscreen to sun-exposed areas.  ? ? ?Epidermal inclusion cyst (2) ?right medial upper thigh; left labia majora ? ?Benign-appearing. Exam most consistent with an epidermal inclusion cyst. Discussed that a cyst is a benign growth that can grow over time and sometimes get irritated or inflamed. ? ?Patient would like to treat cyst at right medial upper thigh, reports recent inflammation. ? ?Will observe cyst L labia ? ? ? ?Incision and Drainage - right medial upper thigh ?Location: right medial upper thigh ? ?Informed Consent: Discussed risks (permanent scarring, light or dark discoloration, infection, pain, bleeding, bruising, redness, damage to adjacent structures, and recurrence of the lesion) and benefits of the procedure, as well as the alternatives.  Informed consent was obtained. ? ?Preparation: The area was prepped with alcohol. ? ?Anesthesia: Lidocaine 2% without epinephrine ? ?Procedure Details: An incision was made overlying the lesion. The lesion drained white, chalky cyst material.  ?Pieces of cyst wall were extracted.    ?Antibiotic ointment and a sterile pressure dressing were applied. The patient tolerated procedure well. ? ?Total number of lesions drained: 1 ? ?Plan: The patient was instructed on post-op care. Recommend OTC analgesia as needed for pain. ? ? ?Inflamed seborrheic keratosis (3) ?right sternal notch x  1, left thigh x 1, left crown x 1 ? ?Vs wart at left thigh  ? ?Recheck right sternal notch at next follow up ? ? ? ? ? ? ?Destruction of lesion - right sternal notch x 1, left thigh x 1, left crown x 1 ? ?Destruction method: cryotherapy   ?Informed  consent: discussed and consent obtained   ?Timeout:  patient name, date of birth, surgical site, and procedure verified ?Lesion destroyed using liquid nitrogen: Yes   ?Region frozen until ice ball extended beyond lesion: Yes   ?Outcome: patient tolerated procedure well with no complications   ?Post-procedure details: wound care instructions given   ?Additional details:  Prior to procedure, discussed risks of blister formation, small wound, skin dyspigmentation, or rare scar following cryotherapy. Recommend Vaseline ointment to treated areas while healing. ? ? ?Milia ?left upper forehead, nasal labial folds ? ?- type of cyst ?- benign ?- may be extracted if symptomatic ?- observe  ? ? ?Bony prominence ?Right Frontal Scalp ? ?Benign. Observe ? ?Present since childhood, no changes ? ?Lentigines ?- Scattered tan macules ?- Due to sun exposure ?- Benign-appearing, observe ?- Recommend daily broad spectrum sunscreen SPF 30+ to sun-exposed areas, reapply every 2 hours as needed. ?- Call for any changes ? ?Seborrheic Keratoses ?- Stuck-on, waxy, tan-brown papules and/or plaques  ?- Benign-appearing ?- Discussed benign etiology and prognosis. ?- Observe ?- Call for any changes ? ?Melanocytic Nevi ?- Tan-brown and/or pink-flesh-colored symmetric macules and papules ?- Benign appearing on exam today ?- Observation ?- Call clinic for new or changing moles ?- Recommend daily use of broad spectrum spf 30+ sunscreen to sun-exposed areas.  ? ?Hemangiomas ?- Red papules at left posterior thigh  ?- Discussed benign nature ?- Observe ?- Call for any changes ? ?Sebaceous Hyperplasia ?- Small yellow papules with a central dell right upper forehead and  ?Left nasal tip 2.5 cm pink yellow flat papule  ?- Benign ?- Observe ? ?Actinic Damage ?- Chronic condition, secondary to cumulative UV/sun exposure ?- diffuse scaly erythematous macules with underlying dyspigmentation ?- Recommend daily broad spectrum sunscreen SPF 30+ to sun-exposed  areas, reapply every 2 hours as needed.  ?- Staying in the shade or wearing long sleeves, sun glasses (UVA+UVB protection) and wide brim hats (4-inch brim around the entire circumference of the hat) are also recommended for sun protection.  ?- Call for new or changing lesions. ? ?History of Basal Cell Carcinoma of the Skin ?- No evidence of recurrence today left nasal dorsum 2021 ?- Recommend regular full body skin exams ?- Recommend daily broad spectrum sunscreen SPF 30+ to sun-exposed areas, reapply every 2 hours as needed.  ?- Call if any new or changing lesions are noted between office visits ? ?Skin cancer screening performed today. ?Return in about 6 months (around 12/27/2021) for TBSE. ?I, Ruthell Rummage, CMA, am acting as scribe for Brendolyn Patty, MD. ? ?Documentation: I have reviewed the above documentation for accuracy and completeness, and I agree with the above. ? ?Brendolyn Patty MD  ? ?

## 2021-07-31 ENCOUNTER — Other Ambulatory Visit: Payer: Self-pay | Admitting: Internal Medicine

## 2021-07-31 ENCOUNTER — Ambulatory Visit
Admission: RE | Admit: 2021-07-31 | Discharge: 2021-07-31 | Disposition: A | Discharge status: Home / Self Care | Payer: PPO | Source: Ambulatory Visit | Attending: Internal Medicine | Admitting: Internal Medicine

## 2021-07-31 DIAGNOSIS — M79662 Pain in left lower leg: Secondary | ICD-10-CM | POA: Diagnosis not present

## 2021-08-16 DIAGNOSIS — I10 Essential (primary) hypertension: Secondary | ICD-10-CM | POA: Diagnosis not present

## 2021-08-23 DIAGNOSIS — E782 Mixed hyperlipidemia: Secondary | ICD-10-CM | POA: Diagnosis not present

## 2021-08-23 DIAGNOSIS — M25562 Pain in left knee: Secondary | ICD-10-CM | POA: Diagnosis not present

## 2021-08-23 DIAGNOSIS — C50919 Malignant neoplasm of unspecified site of unspecified female breast: Secondary | ICD-10-CM | POA: Diagnosis not present

## 2021-08-23 DIAGNOSIS — K589 Irritable bowel syndrome without diarrhea: Secondary | ICD-10-CM | POA: Diagnosis not present

## 2021-08-23 DIAGNOSIS — I10 Essential (primary) hypertension: Secondary | ICD-10-CM | POA: Diagnosis not present

## 2021-09-01 DIAGNOSIS — M1712 Unilateral primary osteoarthritis, left knee: Secondary | ICD-10-CM | POA: Diagnosis not present

## 2021-10-19 DIAGNOSIS — H353132 Nonexudative age-related macular degeneration, bilateral, intermediate dry stage: Secondary | ICD-10-CM | POA: Diagnosis not present

## 2021-12-12 DIAGNOSIS — I10 Essential (primary) hypertension: Secondary | ICD-10-CM | POA: Diagnosis not present

## 2021-12-15 ENCOUNTER — Other Ambulatory Visit: Payer: Self-pay

## 2021-12-15 DIAGNOSIS — C50511 Malignant neoplasm of lower-outer quadrant of right female breast: Secondary | ICD-10-CM

## 2021-12-18 ENCOUNTER — Inpatient Hospital Stay: Payer: PPO | Attending: Internal Medicine

## 2021-12-18 ENCOUNTER — Inpatient Hospital Stay (HOSPITAL_BASED_OUTPATIENT_CLINIC_OR_DEPARTMENT_OTHER): Payer: PPO | Admitting: Medical Oncology

## 2021-12-18 ENCOUNTER — Encounter: Payer: Self-pay | Admitting: Medical Oncology

## 2021-12-18 VITALS — BP 132/59 | HR 71 | Temp 97.8°F | Resp 16 | Ht 67.0 in | Wt 166.0 lb

## 2021-12-18 DIAGNOSIS — Z853 Personal history of malignant neoplasm of breast: Secondary | ICD-10-CM | POA: Diagnosis not present

## 2021-12-18 DIAGNOSIS — Z170 Estrogen receptor positive status [ER+]: Secondary | ICD-10-CM | POA: Insufficient documentation

## 2021-12-18 DIAGNOSIS — Z79899 Other long term (current) drug therapy: Secondary | ICD-10-CM | POA: Insufficient documentation

## 2021-12-18 DIAGNOSIS — R5382 Chronic fatigue, unspecified: Secondary | ICD-10-CM | POA: Diagnosis not present

## 2021-12-18 DIAGNOSIS — Z923 Personal history of irradiation: Secondary | ICD-10-CM | POA: Insufficient documentation

## 2021-12-18 DIAGNOSIS — M8580 Other specified disorders of bone density and structure, unspecified site: Secondary | ICD-10-CM | POA: Diagnosis not present

## 2021-12-18 DIAGNOSIS — L659 Nonscarring hair loss, unspecified: Secondary | ICD-10-CM

## 2021-12-18 DIAGNOSIS — R5383 Other fatigue: Secondary | ICD-10-CM

## 2021-12-18 DIAGNOSIS — Z79811 Long term (current) use of aromatase inhibitors: Secondary | ICD-10-CM

## 2021-12-18 DIAGNOSIS — Z9011 Acquired absence of right breast and nipple: Secondary | ICD-10-CM | POA: Diagnosis not present

## 2021-12-18 DIAGNOSIS — C50511 Malignant neoplasm of lower-outer quadrant of right female breast: Secondary | ICD-10-CM | POA: Diagnosis not present

## 2021-12-18 LAB — COMPREHENSIVE METABOLIC PANEL
ALT: 17 U/L (ref 0–44)
AST: 23 U/L (ref 15–41)
Albumin: 4.6 g/dL (ref 3.5–5.0)
Alkaline Phosphatase: 54 U/L (ref 38–126)
Anion gap: 8 (ref 5–15)
BUN: 24 mg/dL — ABNORMAL HIGH (ref 8–23)
CO2: 32 mmol/L (ref 22–32)
Calcium: 9.4 mg/dL (ref 8.9–10.3)
Chloride: 98 mmol/L (ref 98–111)
Creatinine, Ser: 1.01 mg/dL — ABNORMAL HIGH (ref 0.44–1.00)
GFR, Estimated: 56 mL/min — ABNORMAL LOW (ref >60)
Glucose, Bld: 119 mg/dL — ABNORMAL HIGH (ref 70–99)
Potassium: 3.6 mmol/L (ref 3.5–5.1)
Sodium: 138 mmol/L (ref 135–145)
Total Bilirubin: 1 mg/dL (ref 0.3–1.2)
Total Protein: 7.5 g/dL (ref 6.5–8.1)

## 2021-12-18 LAB — CBC WITH DIFFERENTIAL/PLATELET
Abs Immature Granulocytes: 0.01 10*3/uL (ref 0.00–0.07)
Basophils Absolute: 0.1 10*3/uL (ref 0.0–0.1)
Basophils Relative: 1 %
Eosinophils Absolute: 0.3 10*3/uL (ref 0.0–0.5)
Eosinophils Relative: 5 %
HCT: 42.4 % (ref 36.0–46.0)
Hemoglobin: 14.3 g/dL (ref 12.0–15.0)
Immature Granulocytes: 0 %
Lymphocytes Relative: 29 %
Lymphs Abs: 1.5 10*3/uL (ref 0.7–4.0)
MCH: 30.9 pg (ref 26.0–34.0)
MCHC: 33.7 g/dL (ref 30.0–36.0)
MCV: 91.6 fL (ref 80.0–100.0)
Monocytes Absolute: 0.6 10*3/uL (ref 0.1–1.0)
Monocytes Relative: 12 %
Neutro Abs: 2.8 10*3/uL (ref 1.7–7.7)
Neutrophils Relative %: 53 %
Platelets: 258 10*3/uL (ref 150–400)
RBC: 4.63 MIL/uL (ref 3.87–5.11)
RDW: 12.9 % (ref 11.5–15.5)
WBC: 5.3 10*3/uL (ref 4.0–10.5)
nRBC: 0 % (ref 0.0–0.2)

## 2021-12-18 LAB — TSH: TSH: 1.776 u[IU]/mL (ref 0.350–4.500)

## 2021-12-18 LAB — VITAMIN D 25 HYDROXY (VIT D DEFICIENCY, FRACTURES): Vit D, 25-Hydroxy: 71.48 ng/mL (ref 30–100)

## 2021-12-18 NOTE — Addendum Note (Signed)
Addended by: Delice Bison E on: 12/18/2021 03:58 PM   Modules accepted: Orders

## 2021-12-18 NOTE — Progress Notes (Signed)
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  INTERVAL HISTORY: Patient returns to clinic today for routine evaluation.  On Aromasin. She reports that she is overall doing well. Having some chronic left knee pain for which she is followed by orthopedics. Fatigue is followed by cardiology and she has a follow up appointment with them next week. She continues to have hair thinning but her hairdresser states that it does not look like she has lost any more hair. Had DEXA in March 2023 and Mammogram in January 2023. No breast changes: lumps, bumps, discharge.   Feels well and remains active.  She has no neurologic complaints.  She denies any recent fevers or illnesses.  She has a good appetite and denies unintentional weight loss.  She has no chest pain, shortness of breath, cough, or hemoptysis.  She denies any nausea, vomiting, constipation, or diarrhea.  She has no urinary complaints.  Patient offers no further specific complaints today.  Wt Readings from Last 3 Encounters:  12/18/21 166 lb (75.3 kg)  06/16/21 169 lb 6.4 oz (76.8 kg)  11/22/20 170 lb 4.8 oz (77.2 kg)     REVIEW OF SYSTEMS:   Review of Systems  Constitutional:  Positive for malaise/fatigue. Negative for fever and weight loss.  Respiratory: Negative.  Negative for cough, hemoptysis and shortness of breath.   Cardiovascular: Negative.  Negative for chest pain and leg swelling.  Gastrointestinal: Negative.  Negative for abdominal pain.  Genitourinary: Negative.  Negative for dysuria.  Musculoskeletal: Negative.  Negative for back pain.  Skin:  Negative.  Negative for rash.  Neurological:  Negative for dizziness, sensory change, focal weakness, weakness and headaches.  Psychiatric/Behavioral: Negative.  The patient is not nervous/anxious.     As per HPI. Otherwise, a complete review of systems is negative.  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   Family history of adverse reaction to anesthesia    brother - PONV   Fibrocystic breast    GERD (gastroesophageal reflux disease)    History of hiatal hernia 04/14/2018   HTN (hypertension)    Hyperlipidemia    IBS (irritable bowel syndrome)    Personal history of radiation therapy    PONV (postoperative nausea and vomiting)    Precordial pain    Recurrent breast cancer, right ([place]) 04/2020   ER/PR (+), HER2/neu equivocal invasive mammary carcinoma   Vertigo    (x1) over 10 yrs ago    PAST SURGICAL HISTORY: Past Surgical History:  Procedure Laterality Date   ABDOMINAL HYSTERECTOMY     age 81   BREAST BIOPSY Right 2003   +   BREAST BIOPSY Right 04/13/2020   calcs, Affirm Bx-[facility]/DCIS   BREAST LUMPECTOMY Right    f/u radiation    CATARACT EXTRACTION W/PHACO Right 08/06/2018   Procedure: CATARACT EXTRACTION PHACO AND INTRAOCULAR LENS PLACEMENT ([place])  RIGHT;  Surgeon: Leandrew Koyanagi, MD;  Location: [place];  Service: Ophthalmology;  Laterality: Right;   CATARACT EXTRACTION W/PHACO Left 08/27/2018   Procedure: CATARACT EXTRACTION PHACO AND INTRAOCULAR LENS PLACEMENT ([place])  LEFT;  Surgeon: Leandrew Koyanagi, MD;  Location: [place];  Service: Ophthalmology;  Laterality: Left;   COLONOSCOPY WITH PROPOFOL N/A 04/14/2018   Procedure: COLONOSCOPY WITH PROPOFOL;  Surgeon: Lollie Sails, MD;  Location: [facility] ENDOSCOPY;  Service:  Endoscopy;  Laterality: N/A;   ESOPHAGOGASTRODUODENOSCOPY (EGD) WITH PROPOFOL N/A 04/14/2018   Procedure: ESOPHAGOGASTRODUODENOSCOPY (EGD) WITH PROPOFOL;  Surgeon: Lollie Sails, MD;  Location: [facility] ENDOSCOPY;  Service: Endoscopy;  Laterality: N/A;   EYE SURGERY     MASTECTOMY Right 05/04/2020   PARTIAL MASTECTOMY WITH AXILLARY SENTINEL LYMPH NODE BIOPSY Right 05/04/2020   Procedure: TOTAL  MASTECTOMY WITH AXILLARY SENTINEL LYMPH NODE BIOPSY;  Surgeon: Herbert Pun, MD;  Location: [facility]S;  Service: General;  Laterality: Right;    FAMILY HISTORY: Family History  Problem Relation Age of Onset   Breast cancer Neg Hx     ADVANCED DIRECTIVES (Y/N):  N  HEALTH MAINTENANCE: Social History   Tobacco Use   Smoking status: Never   Smokeless tobacco: Never  Vaping Use   Vaping Use: Never used  Substance Use Topics   Alcohol use: Not Currently   Drug use: Never     Colonoscopy:  PAP:  Bone density:  Lipid panel:  Allergies  Allergen Reactions   Azithromycin Nausea Only   Cefuroxime Axetil Nausea And Vomiting   Doxycycline Nausea Only    Thrush    Levaquin [Levofloxacin] Nausea Only   Lisinopril     Hypotension    Other Hives    Pneumovac    Penicillin V Potassium Swelling    Childhood reaction - arm swelling   Sulfa Antibiotics Nausea And Vomiting   Naproxen Rash    Current Outpatient Medications  Medication Sig Dispense Refill   acetaminophen (TYLENOL) 325 MG tablet Take 650 mg by mouth every 6 (six) hours as needed.     amLODipine (NORVASC) 10 MG tablet Take 10 mg by mouth daily.     Calcium Carb-Cholecalciferol (CALCIUM 500 + D PO) Take 1 tablet by mouth daily.     Carboxymethylcellul-Glycerin (REFRESH RELIEVA OP) Place 1 drop into both eyes 2 (two) times daily as needed (dry eyes).     cetirizine (ZYRTEC) 10 MG tablet Take 10 mg by mouth daily as needed for allergies.     Cholecalciferol 25 MCG (1000 UT) TBDP Take 1,000 Units by mouth daily.      exemestane (AROMASIN) 25 MG tablet TAKE 1 TABLET BY MOUTH DAILY AFTER BREAKFAST 90 tablet 3   fluocinonide (LIDEX) 0.05 % external solution Apply 1 application. topically at bedtime as needed (scalp dryness). 60 mL 3   guaiFENesin (MUCINEX) 600 MG 12 hr tablet Take 600 mg by mouth 2 (two) times daily as needed for to loosen phlegm.     hydrochlorothiazide (HYDRODIURIL) 12.5 MG tablet Take 12.5 mg by mouth daily.     losartan (COZAAR) 50 MG tablet Take 50 mg by mouth daily. 1.5 tab in morning     Multiple Vitamins-Minerals (CENTRUM SILVER PO) Take 1 tablet by mouth daily.     Multiple Vitamins-Minerals (PRESERVISION AREDS 2) CAPS Take 1 tablet by mouth 2 (two) times daily.     pantoprazole (PROTONIX) 40 MG tablet Take 40 mg by mouth daily.     simvastatin (ZOCOR) 20 MG tablet Take 20 mg by mouth daily.     sodium chloride (  OCEAN) 0.65 % nasal spray Place 1 spray into the nose 2 (two) times daily as needed for congestion.     aspirin 81 MG chewable tablet Chew 81 mg by mouth daily. (Patient not taking: Reported on 05/19/2020)     HYDROcodone-acetaminophen (NORCO/VICODIN) 5-325 MG tablet Take 1 tablet by mouth every 4 (four) hours as needed for moderate pain. (Patient not taking: Reported on 05/19/2020) 16 tablet 0   lidocaine-prilocaine (EMLA) cream Apply topically. (Patient not taking: Reported on 06/16/2021)     meclizine (ANTIVERT) 25 MG tablet Take 25 mg by mouth 3 (three) times daily as needed for dizziness. (Patient not taking: Reported on 05/19/2020)     No current facility-administered medications for this visit.    OBJECTIVE: Vitals:   12/18/21 1011  BP: (!) 132/59  Pulse: 71  Resp: 16  Temp: 97.8 F (36.6 C)  SpO2: 96%     Body mass index is 26 kg/m.    ECOG FS:0 - Asymptomatic  General: Well-developed, well-nourished, no acute distress. Eyes: Pink conjunctiva, anicteric sclera. HEENT: Normocephalic, moist mucous membranes. Breast: Right mastectomy.  Exam deferred today. Lungs:  No audible wheezing or coughing. Heart: Regular rate and rhythm. Abdomen: Soft, nontender, no obvious distention. Musculoskeletal: No edema, cyanosis, or clubbing. Neuro: Alert, answering all questions appropriately. Cranial nerves grossly intact. Skin: No rashes or petechiae noted. Psych: Normal affect.   LAB RESULTS:  Lab Results  Component Value Date   NA 138 12/18/2021   K 3.6 12/18/2021   CL 98 12/18/2021   CO2 32 12/18/2021   GLUCOSE 119 (H) 12/18/2021   BUN 24 (H) 12/18/2021   CREATININE 1.01 (H) 12/18/2021   CALCIUM 9.4 12/18/2021   PROT 7.5 12/18/2021   ALBUMIN 4.6 12/18/2021   AST 23 12/18/2021   ALT 17 12/18/2021   ALKPHOS 54 12/18/2021   BILITOT 1.0 12/18/2021   GFRNONAA 56 (L) 12/18/2021    Lab Results  Component Value Date   WBC 5.3 12/18/2021   NEUTROABS 2.8 12/18/2021   HGB 14.3 12/18/2021   HCT 42.4 12/18/2021   MCV 91.6 12/18/2021   PLT 258 12/18/2021     STUDIES: No results found.  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  PLAN:  1. 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  This was a stage Ia (T1c, N0, M0) grade 1 ER/PR positive, HER-2 negative malignancy.  Patient underwent lumpectomy with adjuvant XRT at that time.  She reports several years of tamoxifen and then was transitioned to Aromasin for a total of 5 years of treatment. This is unlikely a recurrence but rather a second primary.  She underwent right mastectomy on May 04, 2020.  Given the small size of her malignancy she did not require Oncotype testing and chemotherapy was not necessary.  She does not need adjuvant XRT.  Letrozole was discontinued secondary to side effects and patient was placed back on Aromasin.  Today she is doing well. She elects to continue on the  Aromasin. Checking TSH and Vitamin D levels today. Mammogram in Jan 2024. RTC 6 months  2.  Genetics: Referral has been made previously. 3.  Osteopenia: Patient underwent bone mineral density on June 12, 2021 Reported T score of -1.6.  Continue calcium and vitamin D supplementation.  Encouraged her to follow up with orthopedics  to help her activity level and weight bearing activity. DEXA March 2024.  4. Fatigue: Chronic in nature. Stable. TSH and Vitamin D today. Continue cardiology follow up.    Patient expressed understanding and was in agreement with this plan. She also understands that She can call clinic at any time with any questions, concerns, or complaints.    Cancer Staging  Primary cancer of lower-outer quadrant of right breast [facility]) Staging form: Breast, AJCC 8th Edition - Pathologic stage from 05/20/2020: Stage IA (pT1a, pN0, cM0, G1, ER+, PR+, HER2-) - Signed by Lloyd Huger, MD on 05/20/2020 Stage prefix: Initial diagnosis Histologic grading system: 3 grade system   Hughie Closs, PA-C   12/18/2021 12:22 PM

## 2021-12-19 DIAGNOSIS — I10 Essential (primary) hypertension: Secondary | ICD-10-CM | POA: Diagnosis not present

## 2021-12-19 DIAGNOSIS — Z0001 Encounter for general adult medical examination with abnormal findings: Secondary | ICD-10-CM | POA: Diagnosis not present

## 2021-12-19 DIAGNOSIS — Z23 Encounter for immunization: Secondary | ICD-10-CM | POA: Diagnosis not present

## 2021-12-19 DIAGNOSIS — C50919 Malignant neoplasm of unspecified site of unspecified female breast: Secondary | ICD-10-CM | POA: Diagnosis not present

## 2021-12-19 DIAGNOSIS — K589 Irritable bowel syndrome without diarrhea: Secondary | ICD-10-CM | POA: Diagnosis not present

## 2021-12-19 DIAGNOSIS — E782 Mixed hyperlipidemia: Secondary | ICD-10-CM | POA: Diagnosis not present

## 2021-12-28 DIAGNOSIS — I10 Essential (primary) hypertension: Secondary | ICD-10-CM | POA: Diagnosis not present

## 2021-12-28 DIAGNOSIS — R0602 Shortness of breath: Secondary | ICD-10-CM | POA: Diagnosis not present

## 2021-12-28 DIAGNOSIS — E782 Mixed hyperlipidemia: Secondary | ICD-10-CM | POA: Diagnosis not present

## 2022-01-23 ENCOUNTER — Ambulatory Visit: Payer: PPO | Admitting: Dermatology

## 2022-01-23 DIAGNOSIS — L820 Inflamed seborrheic keratosis: Secondary | ICD-10-CM

## 2022-01-23 DIAGNOSIS — L720 Epidermal cyst: Secondary | ICD-10-CM

## 2022-01-23 DIAGNOSIS — L409 Psoriasis, unspecified: Secondary | ICD-10-CM | POA: Diagnosis not present

## 2022-01-23 DIAGNOSIS — D2239 Melanocytic nevi of other parts of face: Secondary | ICD-10-CM

## 2022-01-23 DIAGNOSIS — L814 Other melanin hyperpigmentation: Secondary | ICD-10-CM | POA: Diagnosis not present

## 2022-01-23 DIAGNOSIS — D229 Melanocytic nevi, unspecified: Secondary | ICD-10-CM | POA: Diagnosis not present

## 2022-01-23 DIAGNOSIS — L578 Other skin changes due to chronic exposure to nonionizing radiation: Secondary | ICD-10-CM

## 2022-01-23 DIAGNOSIS — L821 Other seborrheic keratosis: Secondary | ICD-10-CM

## 2022-01-23 DIAGNOSIS — L649 Androgenic alopecia, unspecified: Secondary | ICD-10-CM

## 2022-01-23 DIAGNOSIS — Z1283 Encounter for screening for malignant neoplasm of skin: Secondary | ICD-10-CM | POA: Diagnosis not present

## 2022-01-23 DIAGNOSIS — Z85828 Personal history of other malignant neoplasm of skin: Secondary | ICD-10-CM | POA: Diagnosis not present

## 2022-01-23 MED ORDER — FLUOCINOLONE ACETONIDE SCALP 0.01 % EX OIL: TOPICAL_OIL | CUTANEOUS | 4 refills | Status: DC

## 2022-01-23 MED ORDER — FLUOCINONIDE 0.05 % EX SOLN: 1.0000 | Freq: Every evening | CUTANEOUS | 4 refills | Status: DC | PRN

## 2022-01-23 NOTE — Patient Instructions (Addendum)
Female Androgenic Alopecia is a chronic condition related to genetics and/or hormonal changes.  In women androgenetic alopecia is commonly associated with menopause but may occur any time after puberty.  It causes hair thinning primarily on the crown with widening of the part and temporal hairline recession.  Can use OTC Rogaine (minoxidil) 5% solution/foam as directed.  Oral treatments in female patients who have no contraindication may include : - Low dose oral minoxidil 1.25 - '5mg'$  daily - Spironolactone 50 - '100mg'$  bid - Finasteride 2.5 - 5 mg daily Adjunctive therapies include: - Low Level Laser Light Therapy (LLLT) - Platelet-rich plasma injections (PRP) - Hair Transplants or scalp reduction   Doses of minoxidil for hair loss are considered 'low dose'. This is because the doses used for hair loss are a lot lower than the doses which are used for conditions such as high blood pressure (hypertension). The doses used for hypertension are 10-'40mg'$  per day.  Side effects are uncommon at the low doses (up to 2.5 mg/day) used to treat hair loss. Potential side effects, more commonly seen at higher doses, include: Increase in hair growth (hypertrichosis) elsewhere on face and body Temporary hair shedding upon starting medication which may last up to 4 weeks Ankle swelling, fluid retention, rapid weight gain more than 5 pounds Low blood pressure and feeling lightheaded or dizzy when standing up quickly Fast or irregular heartbeat Headaches       Continue T Sal shampoo to help with scale and itch at scalp Continue Flucionide oil apply to any itchy areas at scalp as needed Start dermasmoothe scalp oil apply once weekly to scalp before bed cover with shower cap and rinse out in morning    Cryotherapy Aftercare  Wash gently with soap and water everyday.   Apply Vaseline and Band-Aid daily until healed.     Seborrheic Keratosis  What causes seborrheic keratoses? Seborrheic keratoses  are harmless, common skin growths that first appear during adult life.  As time goes by, more growths appear.  Some people may develop a large number of them.  Seborrheic keratoses appear on both covered and uncovered body parts.  They are not caused by sunlight.  The tendency to develop seborrheic keratoses can be inherited.  They vary in color from skin-colored to gray, brown, or even black.  They can be either smooth or have a rough, warty surface.   Seborrheic keratoses are superficial and look as if they were stuck on the skin.  Under the microscope this type of keratosis looks like layers upon layers of skin.  That is why at times the top layer may seem to fall off, but the rest of the growth remains and re-grows.    Treatment Seborrheic keratoses do not need to be treated, but can easily be removed in the office.  Seborrheic keratoses often cause symptoms when they rub on clothing or jewelry.  Lesions can be in the way of shaving.  If they become inflamed, they can cause itching, soreness, or burning.  Removal of a seborrheic keratosis can be accomplished by freezing, burning, or surgery. If any spot bleeds, scabs, or grows rapidly, please return to have it checked, as these can be an indication of a skin cancer.      Melanoma ABCDEs  Melanoma is the most dangerous type of skin cancer, and is the leading cause of death from skin disease.  You are more likely to develop melanoma if you: Have light-colored skin, light-colored eyes, or red  or blond hair Spend a lot of time in the sun Tan regularly, either outdoors or in a tanning bed Have had blistering sunburns, especially during childhood Have a close family member who has had a melanoma Have atypical moles or large birthmarks  Early detection of melanoma is key since treatment is typically straightforward and cure rates are extremely high if we catch it early.   The first sign of melanoma is often a change in a mole or a new dark spot.   The ABCDE system is a way of remembering the signs of melanoma.  A for asymmetry:  The two halves do not match. B for border:  The edges of the growth are irregular. C for color:  A mixture of colors are present instead of an even brown color. D for diameter:  Melanomas are usually (but not always) greater than 11m - the size of a pencil eraser. E for evolution:  The spot keeps changing in size, shape, and color.  Please check your skin once per month between visits. You can use a small mirror in front and a large mirror behind you to keep an eye on the back side or your body.   If you see any new or changing lesions before your next follow-up, please call to schedule a visit.  Please continue daily skin protection including broad spectrum sunscreen SPF 30+ to sun-exposed areas, reapplying every 2 hours as needed when you're outdoors.   Staying in the shade or wearing long sleeves, sun glasses (UVA+UVB protection) and wide brim hats (4-inch brim around the entire circumference of the hat) are also recommended for sun protection.    Due to recent changes in healthcare laws, you may see results of your pathology and/or laboratory studies on MyChart before the doctors have had a chance to review them. We understand that in some cases there may be results that are confusing or concerning to you. Please understand that not all results are received at the same time and often the doctors may need to interpret multiple results in order to provide you with the best plan of care or course of treatment. Therefore, we ask that you please give u[place]2 business days to thoroughly review all your results before contacting the office for clarification. Should we see a critical lab result, you will be contacted sooner.   If You Need Anything After Your Visit  If you have any questions or concerns for your doctor, please call our main line at [phone] press option 4 to reach your doctor's medical assistant.  If no one answers, please leave a voicemail as directed and we will return your call as soon as possible. Messages left after 4 pm will be answered the following business day.   You may also send [place] message via [facility] We typically respond to MyChart messages within 1-2 business days.  For prescription refills, please ask your pharmacy to contact our office. Our fax number is [phone]  If you have an urgent issue when the clinic is closed that cannot wait until the next business day, you can page your doctor at the number below.    Please note that while we do our best to be available for urgent issues outside of office hours, we are not available 24/7.   If you have an urgent issue and are unable to reach u[place] you may choose to seek medical care at your doctor's office, retail clinic, urgent care center, or emergency room.  If you have a medical emergency, please immediately call 911 or go to the emergency department.  Pager Numbers  - Dr. Nehemiah Massed: [phone]  - Dr. Laurence Ferrari: [phone]  - Dr. Nicole Kindred: [phone]  In the event of inclement weather, please call our main line at [phone] for an update on the status of any delays or closures.  Dermatology Medication Tips: Please keep the boxes that topical medications come in in order to help keep track of the instructions about where and how to use these. Pharmacies typically print the medication instructions only on the boxes and not directly on the medication tubes.   If your medication is too expensive, please contact our office at [phone] option 4 or send [place] a message through [place].   We are unable to tell what your co-pay for medications will be in advance as this is different depending on your insurance coverage. However, we may be able to find a substitute medication at lower cost or fill out paperwork to get insurance to cover a needed medication.   If a prior authorization is required to get your medication  covered by your insurance company, please allow [place] 1-2 business days to complete this process.  Drug prices often vary depending on where the prescription is filled and some pharmacies may offer cheaper prices.  The website www.goodrx.com contains coupons for medications through different pharmacies. The prices here do not account for what the cost may be with help from insurance (it may be cheaper with your insurance), but the website can give you the price if you did not use any insurance.  - You can print the associated coupon and take it with your prescription to the pharmacy.  - You may also stop by our office during regular business hours and pick up a GoodRx coupon card.  - If you need your prescription sent electronically to a different pharmacy, notify our office through [facility] or by phone at [phone] option 4.     Si Usted Necesita Algo Despus de Su Visita  Tambin puede enviarnos un mensaje a travs de Pharmacist, community. Por lo general respondemos a los mensajes de MyChart en el transcurso de 1 a 2 das hbiles.  Para renovar recetas, por favor pida a su farmacia que se ponga en contacto con nuestra oficina. Harland Dingwall de fax es [place] [phone].  Si tiene un asunto urgente cuando la clnica est cerrada y que no puede esperar hasta el siguiente da hbil, puede llamar/localizar a su doctor(a) al nmero que aparece a continuacin.   Por favor, tenga en cuenta que aunque hacemos todo lo posible para estar disponibles para asuntos urgentes fuera del horario de [place], no estamos disponibles las 24 horas del da, los 7 das de la [place].   Si tiene un problema urgente y no puede comunicarse con nosotros, puede optar por buscar atencin mdica  en el consultorio de su doctor(a), en una clnica privada, en un centro de atencin urgente o en una sala de emergencias.  Si tiene Engineering geologist, por favor llame inmediatamente al 911 o vaya a la sala de  emergencias.  Nmeros de bper  - Dr. Nehemiah Massed: [phone]  - Dra. Moye: [phone]  - Dra. Nicole Kindred: [phone]  En caso de inclemencias del [place], por favor llame a Johnsie Kindred principal al [phone] para una actualizacin sobre el [place] de cualquier retraso o cierre.  Consejos para la medicacin en dermatologa: Por favor, guarde las cajas en las que vienen los medicamentos de  uso tpico para ayudarle a seguir las [employer] dnde y cmo usarlos. Las farmacias generalmente imprimen las instrucciones del medicamento slo en las cajas y no directamente en los tubos del [place].   Si su medicamento es muy caro, por favor, pngase en contacto con Zigmund Daniel llamando al [phone] y presione la opcin 4 o envenos un mensaje a travs de Pharmacist, community.   No podemos decirle cul ser su copago por los medicamentos por adelantado ya que esto es diferente dependiendo de la cobertura de su seguro. Sin embargo, es posible que podamos encontrar un medicamento sustituto a Electrical engineer un formulario para que el seguro cubra el medicamento que se considera necesario.   Si se requiere una autorizacin previa para que su compaa de seguros [place] su medicamento, por favor permtanos de 1 a 2 das hbiles para completar este proceso.  Los precios de los medicamentos varan con frecuencia dependiendo del Environmental consultant de dnde se surte la receta y alguna farmacias pueden ofrecer precios ms baratos.  El sitio web www.goodrx.com tiene cupones para medicamentos de Airline pilot. Los precios aqu no tienen en cuenta lo que podra costar con la ayuda del seguro (puede ser ms barato con su seguro), pero el sitio web puede darle el precio si no utiliz Research scientist (physical sciences).  - Puede imprimir el cupn correspondiente y llevarlo con su receta a la farmacia.  - Tambin puede pasar por nuestra oficina durante el horario de atencin regular y Charity fundraiser una tarjeta de cupones de GoodRx.  - Si  necesita que su receta se enve electrnicamente a una farmacia diferente, informe a nuestra oficina a travs de MyChart de [place] o por telfono llamando al [phone] y presione la opcin 4.

## 2022-01-23 NOTE — Progress Notes (Signed)
Follow-Up Visit   Subjective  Cheryl Morris is a 81 y.o. female who presents for the following: Annual Exam (6 month tbse. Hx of psoriasis , hx of bcc,. Check scalp, under left arm spot , small spots around neck. ). Spots on neck are irritated and itchy.  The patient presents for Total-Body Skin Exam (TBSE) for skin cancer screening and mole check.  The patient has spots, moles and lesions to be evaluated, some may be new or changing and the patient has concerns that these could be cancer.   The following portions of the chart were reviewed this encounter and updated as appropriate:      Review of Systems: No other skin or systemic complaints except as noted in HPI or Assessment and Plan.   Objective  Well appearing patient in no apparent distress; mood and affect are within normal limits.  A full examination was performed including scalp, head, eyes, ears, nose, lips, neck, chest, axillae, abdomen, back, buttocks, bilateral upper extremities, bilateral lower extremities, hands, feet, fingers, toes, fingernails, and toenails. All findings within normal limits unless otherwise noted below.  Scalp Erythema and scale on occipital scalp  right perioral area Smooth white papule(s).   left nasal tip 2.5 mm flesh colored fibrous papule   right anterior jaw 4.0 mm flesh papule with emerging hair  left neck x 1, posterior left neck x 4 (5) Erythematous stuck-on, waxy papule or plaque  Scalp Diffuse thinning of the crown and widening of the midline part with retention of the frontal hairline - Reviewed progressive nature and prognosis.     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 Condition is bothersome/symptomatic for patient. Currently flared.    Psoriasis is a chronic non-curable, but treatable genetic/hereditary disease that may have other systemic features  affecting other organ systems such as joints (Psoriatic Arthritis). It is associated with an increased risk of inflammatory bowel disease, heart disease, non-alcoholic fatty liver disease, and depression.     Continue T-Sal shampoo Continue fluocinonide solution 1-2 times daily until itchy rash cleared and prn flares. Start DermaSmoothe fs scalp oil - apply to aa scalp once weekly cover with shower cap overnight then wash out in morning.     fluocinonide (LIDEX) 0.05 % external solution - Scalp Apply 1 Application topically at bedtime as needed (scalp dryness).  Fluocinolone Acetonide Scalp (DERMA-SMOOTHE/FS SCALP) 0.01 % OIL - Scalp Apply to aa of scalp once weekly cover with shower cap wash out in morning  Milia right perioral area  Benign, observe.   Discussed if bothersome could extract  Patient deferred treatment at this time.   Fibrous papule of nose left nasal tip  Vs sebaceous hyperplasia  Benign-appearing. Stable compared to previous visit. Observation.  .    Nevus right anterior jaw  Benign-appearing. Stable compared to previous visit. Observation.  Call clinic for new or changing moles.  Recommend daily use of broad spectrum spf 30+ sunscreen to sun-exposed areas.    Inflamed seborrheic keratosis (5) left neck x 1, posterior left neck x 4  Symptomatic, irritating, patient would like treated.  Destruction of lesion - left neck x 1, posterior left neck x 4  Destruction method: cryotherapy   Informed consent: discussed and consent obtained   Lesion destroyed using liquid nitrogen: Yes   Region frozen until ice ball extended beyond lesion: Yes   Outcome: patient tolerated procedure well with no  complications   Post-procedure details: wound care instructions given   Additional details:  Prior to procedure, discussed risks of blister formation, small wound, skin dyspigmentation, or rare scar following cryotherapy. Recommend Vaseline ointment to treated areas  while healing.   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  In women androgenetic alopecia is commonly associated with menopause but may occur any time after puberty.  It causes hair thinning primarily on the crown with widening of the part and temporal hairline recession.  Can use OTC Rogaine (minoxidil) 5% solution/foam as directed.  Oral treatments in female patients who have no contraindication may include : - Low dose oral minoxidil 1.25 - '5mg'$  daily - Spironolactone 50 - '100mg'$  bid - Finasteride 2.5 - 5 mg daily Adjunctive therapies include: - Low Level Laser Light Therapy (LLLT) - Platelet-rich plasma injections (PRP) - Hair Transplants or scalp reduction  Doses of minoxidil for hair loss are considered 'low dose'. This is because the doses used for hair loss are a lot lower than the doses which are used for conditions such as high blood pressure (hypertension). The doses used for hypertension are 10-'40mg'$  per day.  Side effects are uncommon at the low doses (up to 2.5 mg/day) used to treat hair loss. Potential side effects, more commonly seen at higher doses, include: Increase in hair growth (hypertrichosis) elsewhere on face and body Temporary hair shedding upon starting medication which may last up to 4 weeks Ankle swelling, fluid retention, rapid weight gain more than 5 pounds Low blood pressure and feeling lightheaded or dizzy when standing up quickly Fast or irregular heartbeat Headaches  Discussed starting low dose minoxidil for hair thinning.  Patient instructed to discuss with her oncologist.   Lentigines - Scattered tan macules - Due to sun exposure - Benign-appearing, observe - Recommend daily broad spectrum sunscreen SPF 30+ to sun-exposed areas, reapply every 2 hours as needed. - Call for any changes  Sebaceous Hyperplasia Forehead  -  Small yellow papules with a central dell - Benign - Observe  Seborrheic Keratoses Left axilla , anterior neck and posterior neck - Stuck-on, waxy, tan-brown papules and/or plaques  - Benign-appearing - Discussed benign etiology and prognosis. - Observe - Call for any changes  Melanocytic Nevi - Tan-brown and/or pink-flesh-colored symmetric macules and papules - Benign appearing on exam today - Observation - Call clinic for new or changing moles - Recommend daily use of broad spectrum spf 30+ sunscreen to sun-exposed areas.   Hemangiomas - Red papules - Discussed benign nature - Observe - Call for any changes  Actinic Damage - Chronic condition, secondary to cumulative UV/sun exposure - diffuse scaly erythematous macules with underlying dyspigmentation - Recommend daily broad spectrum sunscreen SPF 30+ to sun-exposed areas, reapply every 2 hours as needed.  - Staying in the shade or wearing long sleeves, sun glasses (UVA+UVB protection) and wide brim hats (4-inch brim around the entire circumference of the hat) are also recommended for sun protection.  - Call for new or changing lesions.  History of Basal Cell Carcinoma of the Skin - No evidence of recurrence today left nasal dorsum 2021 - Recommend regular full body skin exams - Recommend daily broad spectrum sunscreen SPF 30+ to sun-exposed areas, reapply every 2 hours as needed.  - Call if any new or changing lesions are noted between office visits   Skin cancer screening performed today. Return  for 6 month psoriasis follow up, 1 year tbse . I, Ruthell Rummage, CMA, am acting as scribe for Brendolyn Patty, MD.  Documentation: I have reviewed the above documentation for accuracy and completeness, and I agree with the above.  Brendolyn Patty MD

## 2022-01-24 DIAGNOSIS — M1612 Unilateral primary osteoarthritis, left hip: Secondary | ICD-10-CM | POA: Diagnosis not present

## 2022-01-24 DIAGNOSIS — M1712 Unilateral primary osteoarthritis, left knee: Secondary | ICD-10-CM | POA: Diagnosis not present

## 2022-03-22 DIAGNOSIS — M1612 Unilateral primary osteoarthritis, left hip: Secondary | ICD-10-CM | POA: Diagnosis not present

## 2022-03-22 DIAGNOSIS — M5416 Radiculopathy, lumbar region: Secondary | ICD-10-CM | POA: Diagnosis not present

## 2022-03-22 DIAGNOSIS — M1712 Unilateral primary osteoarthritis, left knee: Secondary | ICD-10-CM | POA: Diagnosis not present

## 2022-03-29 ENCOUNTER — Ambulatory Visit
Admission: RE | Admit: 2022-03-29 | Discharge: 2022-03-29 | Disposition: A | Discharge status: Home / Self Care | Payer: PPO | Source: Ambulatory Visit | Attending: Medical Oncology | Admitting: Medical Oncology

## 2022-03-29 DIAGNOSIS — Z853 Personal history of malignant neoplasm of breast: Secondary | ICD-10-CM | POA: Insufficient documentation

## 2022-03-29 DIAGNOSIS — Z1231 Encounter for screening mammogram for malignant neoplasm of breast: Secondary | ICD-10-CM | POA: Diagnosis not present

## 2022-03-29 DIAGNOSIS — C50511 Malignant neoplasm of lower-outer quadrant of right female breast: Secondary | ICD-10-CM

## 2022-04-05 DIAGNOSIS — C50511 Malignant neoplasm of lower-outer quadrant of right female breast: Secondary | ICD-10-CM | POA: Diagnosis not present

## 2022-04-17 DIAGNOSIS — H353132 Nonexudative age-related macular degeneration, bilateral, intermediate dry stage: Secondary | ICD-10-CM | POA: Diagnosis not present

## 2022-04-17 DIAGNOSIS — Z961 Presence of intraocular lens: Secondary | ICD-10-CM | POA: Diagnosis not present

## 2022-04-17 DIAGNOSIS — H26492 Other secondary cataract, left eye: Secondary | ICD-10-CM | POA: Diagnosis not present

## 2022-05-17 DIAGNOSIS — H26492 Other secondary cataract, left eye: Secondary | ICD-10-CM | POA: Diagnosis not present

## 2022-05-22 DIAGNOSIS — Z1331 Encounter for screening for depression: Secondary | ICD-10-CM | POA: Diagnosis not present

## 2022-05-22 DIAGNOSIS — Z124 Encounter for screening for malignant neoplasm of cervix: Secondary | ICD-10-CM | POA: Diagnosis not present

## 2022-06-06 DIAGNOSIS — M1712 Unilateral primary osteoarthritis, left knee: Secondary | ICD-10-CM | POA: Diagnosis not present

## 2022-06-13 DIAGNOSIS — I10 Essential (primary) hypertension: Secondary | ICD-10-CM | POA: Diagnosis not present

## 2022-06-19 ENCOUNTER — Inpatient Hospital Stay (HOSPITAL_BASED_OUTPATIENT_CLINIC_OR_DEPARTMENT_OTHER): Payer: HMO | Admitting: Oncology

## 2022-06-19 ENCOUNTER — Inpatient Hospital Stay: Payer: HMO | Attending: Oncology

## 2022-06-19 VITALS — BP 148/56 | HR 69 | Temp 97.1°F | Ht 66.0 in | Wt 159.0 lb

## 2022-06-19 DIAGNOSIS — C50511 Malignant neoplasm of lower-outer quadrant of right female breast: Secondary | ICD-10-CM | POA: Insufficient documentation

## 2022-06-19 DIAGNOSIS — Z79899 Other long term (current) drug therapy: Secondary | ICD-10-CM | POA: Diagnosis not present

## 2022-06-19 DIAGNOSIS — Z170 Estrogen receptor positive status [ER+]: Secondary | ICD-10-CM | POA: Insufficient documentation

## 2022-06-19 DIAGNOSIS — M8580 Other specified disorders of bone density and structure, unspecified site: Secondary | ICD-10-CM | POA: Insufficient documentation

## 2022-06-19 LAB — COMPREHENSIVE METABOLIC PANEL
ALT: 22 U/L (ref 0–44)
AST: 21 U/L (ref 15–41)
Albumin: 4.6 g/dL (ref 3.5–5.0)
Alkaline Phosphatase: 59 U/L (ref 38–126)
Anion gap: 9 (ref 5–15)
BUN: 24 mg/dL — ABNORMAL HIGH (ref 8–23)
CO2: 29 mmol/L (ref 22–32)
Calcium: 9.2 mg/dL (ref 8.9–10.3)
Chloride: 101 mmol/L (ref 98–111)
Creatinine, Ser: 0.94 mg/dL (ref 0.44–1.00)
GFR, Estimated: >60 mL/min (ref >60)
Glucose, Bld: 116 mg/dL — ABNORMAL HIGH (ref 70–99)
Potassium: 3.4 mmol/L — ABNORMAL LOW (ref 3.5–5.1)
Sodium: 139 mmol/L (ref 135–145)
Total Bilirubin: 0.9 mg/dL (ref 0.3–1.2)
Total Protein: 7.4 g/dL (ref 6.5–8.1)

## 2022-06-19 LAB — CBC WITH DIFFERENTIAL/PLATELET
Abs Immature Granulocytes: 0.02 10*3/uL (ref 0.00–0.07)
Basophils Absolute: 0.1 10*3/uL (ref 0.0–0.1)
Basophils Relative: 1 %
Eosinophils Absolute: 0.3 10*3/uL (ref 0.0–0.5)
Eosinophils Relative: 3 %
HCT: 41.8 % (ref 36.0–46.0)
Hemoglobin: 14 g/dL (ref 12.0–15.0)
Immature Granulocytes: 0 %
Lymphocytes Relative: 22 %
Lymphs Abs: 1.7 10*3/uL (ref 0.7–4.0)
MCH: 31.3 pg (ref 26.0–34.0)
MCHC: 33.5 g/dL (ref 30.0–36.0)
MCV: 93.3 fL (ref 80.0–100.0)
Monocytes Absolute: 0.7 10*3/uL (ref 0.1–1.0)
Monocytes Relative: 8 %
Neutro Abs: 5.1 10*3/uL (ref 1.7–7.7)
Neutrophils Relative %: 66 %
Platelets: 267 10*3/uL (ref 150–400)
RBC: 4.48 MIL/uL (ref 3.87–5.11)
RDW: 12.8 % (ref 11.5–15.5)
WBC: 7.9 10*3/uL (ref 4.0–10.5)
nRBC: 0 % (ref 0.0–0.2)

## 2022-06-19 NOTE — Progress Notes (Signed)
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  INTERVAL HISTORY: Patient returns to clinic today for routine 4324-month evaluation.  She continues to have left knee pain which has improved with steroid injections from orthopedics.  She otherwise feels well.  She has some mild hair thinning from Aromasin.  She continues to remain active.  She has no neurologic complaints.  She denies any recent fevers or illnesses.  She has a good appetite and denies weight loss.  She has no chest pain, shortness of breath, cough, or hemoptysis.  She denies any nausea, vomiting, constipation, or diarrhea.  She has no urinary complaints.  Patient offers no further specific complaints today.  REVIEW OF SYSTEMS:   Review of Systems  Constitutional: Negative.  Negative for fever, malaise/fatigue and weight loss.  Respiratory: Negative.  Negative for cough, hemoptysis and shortness of breath.   Cardiovascular: Negative.  Negative for chest pain and leg swelling.  Gastrointestinal: Negative.  Negative for abdominal pain.  Genitourinary: Negative.  Negative for dysuria.  Musculoskeletal:  Positive for joint pain. Negative for back pain.  Skin: Negative.  Negative for rash.  Neurological: Negative.  Negative for dizziness, sensory change, focal weakness, weakness and headaches.  Psychiatric/Behavioral: Negative.  The patient is not nervous/anxious.     As per HPI. Otherwise, a complete review of systems is negative.  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   Family history of adverse reaction to anesthesia    brother - PONV   Fibrocystic breast    GERD (gastroesophageal reflux disease)    History of hiatal hernia 04/14/2018   HTN (hypertension)    Hyperlipidemia    IBS (irritable bowel syndrome)    Personal history of radiation therapy    PONV (postoperative nausea and vomiting)    Precordial pain    Recurrent breast cancer, right (HCC) 04/2020   ER/PR (+), HER2/neu equivocal invasive mammary carcinoma   Vertigo    (x1) over 10 yrs ago    PAST SURGICAL HISTORY: Past Surgical History:  Procedure Laterality Date   ABDOMINAL HYSTERECTOMY     age [age]   BREAST BIOPSY Right 2003   +   BREAST BIOPSY Right 04/13/2020   calcs, Affirm Bx-IMC/DCIS   BREAST LUMPECTOMY Right    f/u radiation    CATARACT EXTRACTION W/PHACO Right 08/06/2018   Procedure: CATARACT EXTRACTION PHACO AND INTRAOCULAR LENS PLACEMENT (IOC)  RIGHT;  Surgeon: Lockie MolaBrasington, Chadwick, MD;  [facility];  Service: Ophthalmology;  Laterality: Right;   CATARACT EXTRACTION W/PHACO Left 08/27/2018   Procedure: CATARACT EXTRACTION PHACO AND INTRAOCULAR LENS PLACEMENT (IOC)  LEFT;  Surgeon: Lockie MolaBrasington, Chadwick, MD;  [facility];  Service: Ophthalmology;  Laterality: Left;   COLONOSCOPY WITH PROPOFOL N/A 04/14/2018   Procedure: COLONOSCOPY WITH PROPOFOL;  Surgeon: Christena DeemSkulskie, Martin U, MD;  [facility];  Service: Endoscopy;  Laterality: N/A;  ESOPHAGOGASTRODUODENOSCOPY (EGD) WITH PROPOFOL N/A 04/14/2018   Procedure: ESOPHAGOGASTRODUODENOSCOPY (EGD) WITH PROPOFOL;  Surgeon: Christena Deem, MD;  Location: [facility] ENDOSCOPY;  Service: Endoscopy;  Laterality: N/A;   EYE SURGERY     MASTECTOMY Right 05/04/2020   PARTIAL MASTECTOMY WITH AXILLARY  SENTINEL LYMPH NODE BIOPSY Right 05/04/2020   Procedure: TOTAL  MASTECTOMY WITH AXILLARY SENTINEL LYMPH NODE BIOPSY;  Surgeon: Carolan Shiver, MD;  Location: [facility] ORS;  Service: General;  Laterality: Right;    FAMILY HISTORY: Family History  Problem Relation Age of Onset   Breast cancer Neg Hx     ADVANCED DIRECTIVES (Y/N):  N  HEALTH MAINTENANCE: Social History   Tobacco Use   Smoking status: Never   Smokeless tobacco: Never  Vaping Use   Vaping Use: Never used  Substance Use Topics   Alcohol use: Not Currently   Drug use: Never     Colonoscopy:  PAP:  Bone density:  Lipid panel:  Allergies  Allergen Reactions   Azithromycin Nausea Only   Cefuroxime Axetil Nausea And Vomiting   Doxycycline Nausea Only    Thrush    Levaquin [Levofloxacin] Nausea Only   Lisinopril     Hypotension    Other Hives    Pneumovac    Penicillin V Potassium Swelling    Childhood reaction - arm swelling   Sulfa Antibiotics Nausea And Vomiting   Naproxen Rash    Current Outpatient Medications  Medication Sig Dispense Refill   acetaminophen (TYLENOL) 325 MG tablet Take 650 mg by mouth every 6 (six) hours as needed.     amLODipine (NORVASC) 10 MG tablet Take 10 mg by mouth daily.     aspirin 81 MG chewable tablet Chew 81 mg by mouth daily.     Calcium Carb-Cholecalciferol (CALCIUM 500 + D PO) Take 1 tablet by mouth daily.     Carboxymethylcellul-Glycerin (REFRESH RELIEVA OP) Place 1 drop into both eyes 2 (two) times daily as needed (dry eyes).     cetirizine (ZYRTEC) 10 MG tablet Take 10 mg by mouth daily as needed for allergies.     Cholecalciferol 25 MCG (1000 UT) TBDP Take 1,000 Units by mouth daily.     exemestane (AROMASIN) 25 MG tablet TAKE 1 TABLET BY MOUTH DAILY AFTER BREAKFAST 90 tablet 3   Fluocinolone Acetonide Scalp (DERMA-SMOOTHE/FS SCALP) 0.01 % OIL Apply to aa of scalp once weekly cover with shower cap wash out in morning 118.28 mL 4   fluocinonide (LIDEX) 0.05 %  external solution Apply 1 Application topically at bedtime as needed (scalp dryness). 60 mL 4   guaiFENesin (MUCINEX) 600 MG 12 hr tablet Take 600 mg by mouth 2 (two) times daily as needed for to loosen phlegm.     hydrochlorothiazide (HYDRODIURIL) 12.5 MG tablet Take 12.5 mg by mouth daily.     HYDROcodone-acetaminophen (NORCO/VICODIN) 5-325 MG tablet Take 1 tablet by mouth every 4 (four) hours as needed for moderate pain. 16 tablet 0   lidocaine-prilocaine (EMLA) cream Apply topically.     losartan (COZAAR) 50 MG tablet Take 50 mg by mouth daily. 1.5 tab in morning     meclizine (ANTIVERT) 25 MG tablet Take 25 mg by mouth 3 (three) times daily as needed for dizziness.     Multiple Vitamins-Minerals (CENTRUM SILVER PO) Take 1 tablet by mouth daily.     Multiple Vitamins-Minerals (PRESERVISION AREDS 2) CAPS Take 1 tablet by mouth 2 (two) times daily.     pantoprazole (PROTONIX) 40 MG tablet Take  40 mg by mouth daily.     simvastatin (ZOCOR) 20 MG tablet Take 20 mg by mouth daily.     sodium chloride (OCEAN) 0.65 % nasal spray Place 1 spray into the nose 2 (two) times daily as needed for congestion.     No current facility-administered medications for this visit.    OBJECTIVE: Vitals:   06/19/22 1342  BP: (!) 148/56  Pulse: 69  Temp: (!) 97.1 F (36.2 C)     Body mass index is 25.66 kg/m.    ECOG FS:0 - Asymptomatic  General: Well-developed, well-nourished, no acute distress. Eyes: Pink conjunctiva, anicteric sclera. HEENT: Normocephalic, moist mucous membranes. Breast: Exam deferred today. Lungs: No audible wheezing or coughing. Heart: Regular rate and rhythm. Abdomen: Soft, nontender, no obvious distention. Musculoskeletal: No edema, cyanosis, or clubbing. Neuro: Alert, answering all questions appropriately. Cranial nerves grossly intact. Skin: No rashes or petechiae noted. Psych: Normal affect.  LAB RESULTS:  Lab Results  Component Value Date   NA 139 06/19/2022   K 3.4  (L) 06/19/2022   CL 101 06/19/2022   CO2 29 06/19/2022   GLUCOSE 116 (H) 06/19/2022   BUN 24 (H) 06/19/2022   CREATININE 0.94 06/19/2022   CALCIUM 9.2 06/19/2022   PROT 7.4 06/19/2022   ALBUMIN 4.6 06/19/2022   AST 21 06/19/2022   ALT 22 06/19/2022   ALKPHOS 59 06/19/2022   BILITOT 0.9 06/19/2022   GFRNONAA >60 06/19/2022    Lab Results  Component Value Date   WBC 7.9 06/19/2022   NEUTROABS 5.1 06/19/2022   HGB 14.0 06/19/2022   HCT 41.8 06/19/2022   MCV 93.3 06/19/2022   PLT 267 06/19/2022     STUDIES: No results found.  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  Given the small size of her malignancy she did not require Oncotype testing and chemotherapy was not necessary.  She did not require adjuvant XRT.  Letrozole was discontinued secondary to side effects and patient was placed back on Aromasin.  Continue total 5 years of treatment completing in March 2027.  Her most recent left screening mammogram in March 13, 2022 was reported as BI-RADS 1.  Repeat in January 2025.  Return to clinic in 6 months for routine evaluation.   Osteopenia: Patient's most recent bone mineral density on June 12, 2021 reported T-score of -1.6 which was unchanged from 1 year prior.  Continue calcium and vitamin D supplementation.  Will repeat in January 2025 along with mammogram.     Patient expressed understanding and was in agreement with this plan. She also understands that She can call clinic at any time with any questions, concerns, or complaints.    Cancer Staging  Primary cancer of lower-outer quadrant of right breast Staging form: Breast, AJCC 8th Edition - Pathologic stage from 05/20/2020: Stage IA (pT1a, pN0, cM0, G1,  ER+, PR+, HER2-) - Signed by Jeralyn Ruths, MD on 05/20/2020 Stage prefix: Initial diagnosis Histologic grading system: 3 grade system   Jeralyn Ruths, MD   06/19/2022 2:14 PM

## 2022-06-20 DIAGNOSIS — E782 Mixed hyperlipidemia: Secondary | ICD-10-CM | POA: Diagnosis not present

## 2022-06-20 DIAGNOSIS — I10 Essential (primary) hypertension: Secondary | ICD-10-CM | POA: Diagnosis not present

## 2022-06-20 DIAGNOSIS — Z0000 Encounter for general adult medical examination without abnormal findings: Secondary | ICD-10-CM | POA: Diagnosis not present

## 2022-06-20 DIAGNOSIS — K589 Irritable bowel syndrome without diarrhea: Secondary | ICD-10-CM | POA: Diagnosis not present

## 2022-06-20 DIAGNOSIS — C50919 Malignant neoplasm of unspecified site of unspecified female breast: Secondary | ICD-10-CM | POA: Diagnosis not present

## 2022-07-19 DIAGNOSIS — H938X3 Other specified disorders of ear, bilateral: Secondary | ICD-10-CM | POA: Diagnosis not present

## 2022-07-19 DIAGNOSIS — H6123 Impacted cerumen, bilateral: Secondary | ICD-10-CM | POA: Diagnosis not present

## 2022-08-07 ENCOUNTER — Other Ambulatory Visit: Payer: Self-pay | Admitting: Oncology

## 2022-09-04 ENCOUNTER — Ambulatory Visit (INDEPENDENT_AMBULATORY_CARE_PROVIDER_SITE_OTHER): Payer: HMO | Admitting: Dermatology

## 2022-09-04 VITALS — BP 123/62 | HR 59

## 2022-09-04 DIAGNOSIS — L821 Other seborrheic keratosis: Secondary | ICD-10-CM | POA: Diagnosis not present

## 2022-09-04 DIAGNOSIS — L820 Inflamed seborrheic keratosis: Secondary | ICD-10-CM

## 2022-09-04 DIAGNOSIS — L814 Other melanin hyperpigmentation: Secondary | ICD-10-CM | POA: Diagnosis not present

## 2022-09-04 DIAGNOSIS — L720 Epidermal cyst: Secondary | ICD-10-CM | POA: Diagnosis not present

## 2022-09-04 DIAGNOSIS — Z1283 Encounter for screening for malignant neoplasm of skin: Secondary | ICD-10-CM

## 2022-09-04 DIAGNOSIS — D1801 Hemangioma of skin and subcutaneous tissue: Secondary | ICD-10-CM

## 2022-09-04 DIAGNOSIS — L578 Other skin changes due to chronic exposure to nonionizing radiation: Secondary | ICD-10-CM

## 2022-09-04 DIAGNOSIS — L409 Psoriasis, unspecified: Secondary | ICD-10-CM | POA: Diagnosis not present

## 2022-09-04 MED ORDER — FLUOCINONIDE 0.05 % EX SOLN
1.0000 | Freq: Every evening | CUTANEOUS | 4 refills | Status: AC | PRN
Start: 2022-09-04 — End: ?

## 2022-09-04 MED ORDER — FLUOCINOLONE ACETONIDE SCALP 0.01 % EX OIL
TOPICAL_OIL | CUTANEOUS | 4 refills | Status: AC
Start: 2022-09-04 — End: ?

## 2022-09-04 NOTE — Progress Notes (Signed)
Follow-Up Visit   Subjective  Tyshea Imel is a 82 y.o. female who presents for the following: Psoriasis Scalp, using t-sal , flucinolone oil,  Spots at right lower leg, irritated spot at left axilla, spot at back neck, itchy spots at back.  Also has spot by mouth that gets irritated and doesn't clear up   The following portions of the chart were reviewed this encounter and updated as appropriate: medications, allergies, medical history  Review of Systems:  No other skin or systemic complaints except as noted in HPI or Assessment and Plan.  Objective  Well appearing patient in no apparent distress; mood and affect are within normal limits.  Areas Examined: Scalp, right leg, posterior neck, left axilla, back, face   Relevant exam findings are noted in the Assessment and Plan.    Left Axilla x 1, right spinal mid back at bra line x 2 (3) Erythematous stuck-on, waxy papule or plaque    Assessment & Plan   Inflamed seborrheic keratosis (3) Left Axilla x 1, right spinal mid back at bra line x 2  Symptomatic, irritating, patient would like treated.  Destruction of lesion - Left Axilla x 1, right spinal mid back at bra line x 2  Destruction method: cryotherapy   Informed consent: discussed and consent obtained   Lesion destroyed using liquid nitrogen: Yes   Region frozen until ice ball extended beyond lesion: Yes   Outcome: patient tolerated procedure well with no complications   Post-procedure details: wound care instructions given   Additional details:  Prior to procedure, discussed risks of blister formation, small wound, skin dyspigmentation, or rare scar following cryotherapy. Recommend Vaseline ointment to treated areas while healing.   Psoriasis  Related Medications Fluocinolone Acetonide Scalp (DERMA-SMOOTHE/FS SCALP) 0.01 % OIL Apply to aa of scalp once weekly cover with shower cap wash out in morning  fluocinonide (LIDEX) 0.05 % external  solution Apply 1 Application topically at bedtime as needed (scalp dryness).    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 Condition is symptomatic/ bothersome to patient. Not currently at goal, but has improved and less itchy when uses topicals.    Treatment Plan: Continue T-Sal shampoo uses 1 x weekly  Continue fluocinonide solution 1-2 times daily until itchy rash cleared and prn flares. Continue  DermaSmoothe fs scalp oil - apply to aa scalp once weekly cover with shower cap overnight then wash out in morning.     Counseling on psoriasis and coordination of care  psoriasis is a chronic non-curable, but treatable genetic/hereditary disease that may have other systemic features affecting other organ systems such as joints (Psoriatic Arthritis). It is associated with an increased risk of inflammatory bowel disease, heart disease, non-alcoholic fatty liver disease, and depression.  Treatments include light and laser treatments; topical medications; and systemic medications including oral and injectables.   LENTIGINES back Exam: scattered tan macules Due to sun exposure Treatment Plan: Benign-appearing, observe. Recommend daily broad spectrum sunscreen SPF 30+ to sun-exposed areas, reapply every 2 hours as needed.  Call for any changes  HEMANGIOMA Posterior neck Exam: red papule(s) Discussed benign nature. Recommend observation. Call for changes.   SEBORRHEIC KERATOSIS At right lower leg , back  - Stuck-on, waxy, tan-brown papules and/or plaques  - Benign-appearing - Discussed benign etiology and prognosis. - Observe - Call for any changes   MILIA Exam: tiny erythematous firm white  papule at right oral commissure   Discussed this is a type of cyst. Benign-appearing. Sometimes these will clear with OTC adapalene/Differin 0.1% cream QHS or retinol.  Treatment  Plan: Symptomatic, irritating, patient would like treated. Advised may not be covered by insurance.  Procedure risks and benefits were discussed with the patient including bruising and verbal consent was obtained. Following prep of the skin on the right oral commissure with an alcohol swab, area was injected with 1% lidocaine/epinephrine, extraction of milia was performed with cotton tip applicators following superficial incision made over their surfaces with a #11 surgical blade. Capillary hemostasis was achieved with 20% aluminum chloride solution. Vaseline ointment was applied to each site. The patient tolerated the procedure well.    Return for keep follow up as scheduled in November.  I, Asher Muir, CMA, am acting as scribe for Willeen Niece, MD.   Documentation: I have reviewed the above documentation for accuracy and completeness, and I agree with the above.  Willeen Niece, MD

## 2022-09-04 NOTE — Patient Instructions (Addendum)
For scalp psoriasis  Treatment Plan: Continue T-Sal shampoo uses 1 x weekly  Continue fluocinonide solution 1-2 times daily until itchy rash cleared and as needed flares. Continue  DermaSmoothe fs scalp oil - apply to affected areas of scalp once weekly cover with shower cap overnight then wash out in morning.        Cryotherapy Aftercare  Wash gently with soap and water everyday.   Apply Vaseline and Band-Aid daily until healed.     Seborrheic Keratosis  What causes seborrheic keratoses? Seborrheic keratoses are harmless, common skin growths that first appear during adult life.  As time goes by, more growths appear.  Some people may develop a large number of them.  Seborrheic keratoses appear on both covered and uncovered body parts.  They are not caused by sunlight.  The tendency to develop seborrheic keratoses can be inherited.  They vary in color from skin-colored to gray, brown, or even black.  They can be either smooth or have a rough, warty surface.   Seborrheic keratoses are superficial and look as if they were stuck on the skin.  Under the microscope this type of keratosis looks like layers upon layers of skin.  That is why at times the top layer may seem to fall off, but the rest of the growth remains and re-grows.    Treatment Seborrheic keratoses do not need to be treated, but can easily be removed in the office.  Seborrheic keratoses often cause symptoms when they rub on clothing or jewelry.  Lesions can be in the way of shaving.  If they become inflamed, they can cause itching, soreness, or burning.  Removal of a seborrheic keratosis can be accomplished by freezing, burning, or surgery. If any spot bleeds, scabs, or grows rapidly, please return to have it checked, as these can be an indication of a skin cancer.       Due to recent changes in healthcare laws, you may see results of your pathology and/or laboratory studies on MyChart before the doctors have had a chance  to review them. We understand that in some cases there may be results that are confusing or concerning to you. Please understand that not all results are received at the same time and often the doctors may need to interpret multiple results in order to provide you with the best plan of care or course of treatment. Therefore, we ask that you please give [place] 2 business days to thoroughly review all your results before contacting the office for clarification. Should we see a critical lab result, you will be contacted sooner.   If You Need Anything After Your Visit  If you have any questions or concerns for your doctor, please call our main line at [phone] and press option 4 to reach your doctor's medical assistant. If no one answers, please leave a voicemail as directed and we will return your call as soon as possible. Messages left after 4 pm will be answered the following business day.   You may also send [place] a message via MyChart. We typically respond to MyChart messages within 1-2 business days.  For prescription refills, please ask your pharmacy to contact our office. Our fax number is [phone].  If you have an urgent issue when the clinic is closed that cannot wait until the next business day, you can page your doctor at the number below.    Please note that while we do our best to be available for urgent issues outside of  office hours, we are not available 24/7.   If you have an urgent issue and are unable to reach [place], you may choose to seek medical care at your doctor's office, retail clinic, urgent care center, or emergency room.  If you have a medical emergency, please immediately call 911 or go to the emergency department.  Pager Numbers  - Dr. Gwen Pounds: [phone]  - Dr. Neale Burly: [phone]  - Dr. Roseanne Reno: [phone]  In the event of inclement weather, please call our main line at [phone] for an update on the status of any delays or closures.  Dermatology  Medication Tips: Please keep the boxes that topical medications come in in order to help keep track of the instructions about where and how to use these. Pharmacies typically print the medication instructions only on the boxes and not directly on the medication tubes.   If your medication is too expensive, please contact our office at [phone] option 4 or send [place] a message through MyChart.   We are unable to tell what your co-pay for medications will be in advance as this is different depending on your insurance coverage. However, we may be able to find a substitute medication at lower cost or fill out paperwork to get insurance to cover a needed medication.   If a prior authorization is required to get your medication covered by your insurance company, please allow [place] 1-2 business days to complete this process.  Drug prices often vary depending on where the prescription is filled and some pharmacies may offer cheaper prices.  The website www.goodrx.com contains coupons for medications through different pharmacies. The prices here do not account for what the cost may be with help from insurance (it may be cheaper with your insurance), but the website can give you the price if you did not use any insurance.  - You can print the associated coupon and take it with your prescription to the pharmacy.  - You may also stop by our office during regular business hours and pick up a GoodRx coupon card.  - If you need your prescription sent electronically to a different pharmacy, notify our office through [facility] or by phone at [phone] option 4.     Si Usted Necesita Algo Despus de Su Visita  Tambin puede enviarnos un mensaje a travs de Clinical cytogeneticist. Por lo general respondemos a los mensajes de MyChart en el transcurso de 1 a 2 das hbiles.  Para renovar recetas, por favor pida a su farmacia que se ponga en contacto con nuestra oficina. Annie Sable de fax es [place] [phone].  Si  tiene un asunto urgente cuando la clnica est cerrada y que no puede esperar hasta el siguiente da hbil, puede llamar/localizar a su doctor(a) al nmero que aparece a continuacin.   Por favor, tenga en cuenta que aunque hacemos todo lo posible para estar disponibles para asuntos urgentes fuera del horario de [place], no estamos disponibles las 24 horas del da, los 7 [address]  [address] de la [place].   Si tiene un problema urgente y no puede comunicarse con nosotros, puede optar por buscar atencin mdica  en el consultorio de su doctor(a), en una clnica privada, en un centro de atencin urgente o en una sala de emergencias.  Si tiene Engineer, drilling, por favor llame inmediatamente al 911 o vaya a la sala de emergencias.  Nmeros de bper  - Dr. Gwen Pounds: [phone]  - Dra. Moye: [phone]  - Dra. Roseanne Reno: [phone]  En caso de inclemencias  del [place], por favor llame a Ferne Coe lnea principal al [phone] para una actualizacin sobre el estado de cualquier retraso o cierre.  Consejos para la medicacin en dermatologa: Por favor, guarde las cajas en las que vienen los medicamentos de uso tpico para ayudarle a seguir las instrucciones sobre dnde y cmo usarlos. Las farmacias generalmente imprimen las instrucciones del medicamento slo en las cajas y no directamente en los tubos del [place].   Si su medicamento es muy caro, por favor, pngase en contacto con Rolm Gala llamando al [phone] y presione la opcin 4 o envenos un mensaje a travs de Clinical cytogeneticist.   No podemos decirle cul ser su copago por los medicamentos por adelantado ya que esto es diferente dependiendo de la cobertura de su seguro. Sin embargo, es posible que podamos encontrar un medicamento sustituto a Audiological scientist un formulario para que el seguro cubra el medicamento que se considera necesario.   Si se requiere una autorizacin previa para que su compaa de seguros [place] su medicamento, por favor  permtanos de 1 a 2 das hbiles para completar [address]  Los precios de los medicamentos varan con frecuencia dependiendo del Environmental consultant de dnde se surte la receta y alguna farmacias pueden ofrecer precios ms baratos.  El sitio web www.goodrx.com tiene cupones para medicamentos de Health and safety inspector. Los precios aqu no tienen en cuenta lo que podra costar con la ayuda del seguro (puede ser ms barato con su seguro), pero el sitio web puede darle el precio si no utiliz Tourist information centre manager.  - Puede imprimir el cupn correspondiente y llevarlo con su receta a la farmacia.  - Tambin puede pasar por nuestra oficina durante el horario de atencin regular y Education officer, museum una tarjeta de cupones de GoodRx.  - Si necesita que su receta se enve electrnicamente a una farmacia diferente, informe a nuestra oficina a travs de MyChart de [place] o por telfono llamando al [phone] y presione la opcin 4.

## 2022-10-10 DIAGNOSIS — J069 Acute upper respiratory infection, unspecified: Secondary | ICD-10-CM | POA: Diagnosis not present

## 2022-10-12 IMAGING — MG MM DIGITAL DIAGNOSTIC UNILAT*R*
5 series · 6 of 9 positions shown · non-contrast
Comparison: Previous exam(s).

CLINICAL DATA: 79-year-old female recalled from screening mammogram
dated 03/23/2020 for right breast calcifications. Patient has a
remote history of treated right breast cancer.

EXAM:
DIGITAL DIAGNOSTIC RIGHT MAMMOGRAM WITH CAD

[R CC]
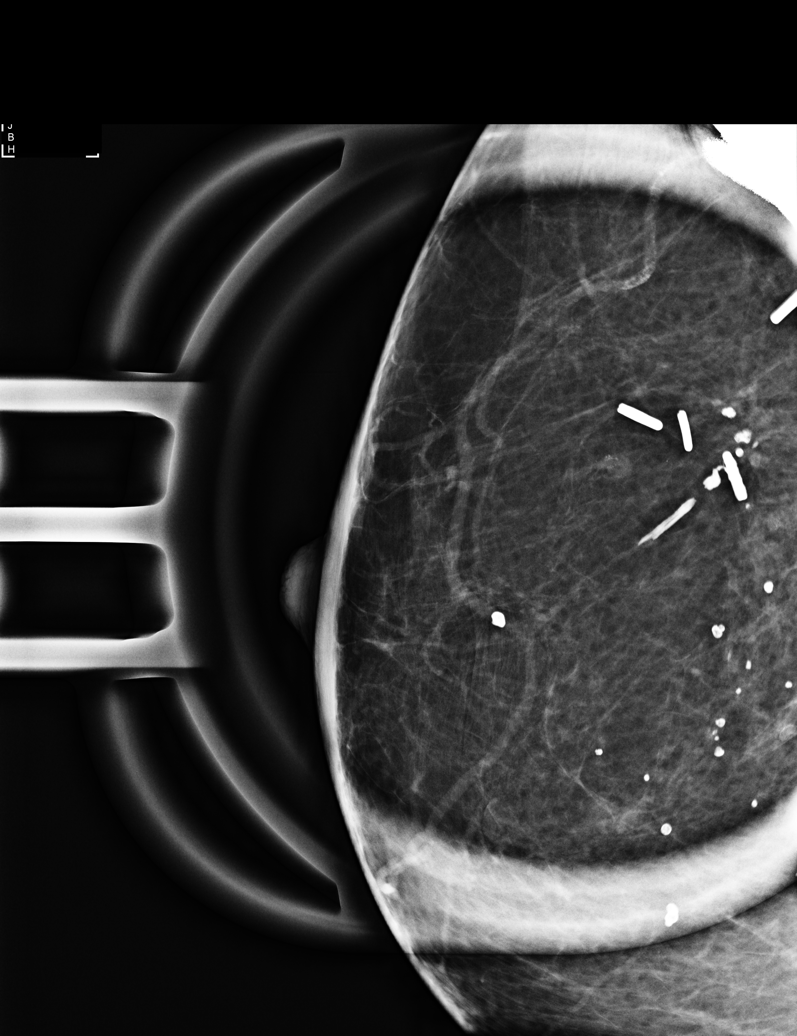

[R ML (1 of 2)]
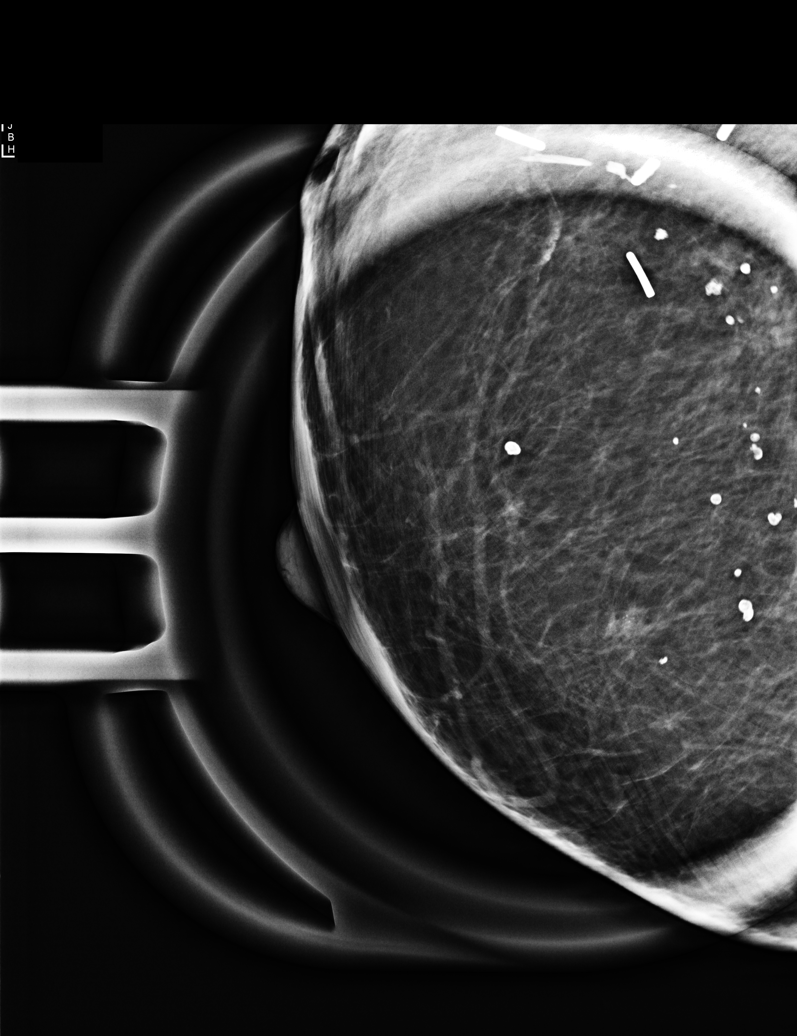

[R ML (2 of 2)]
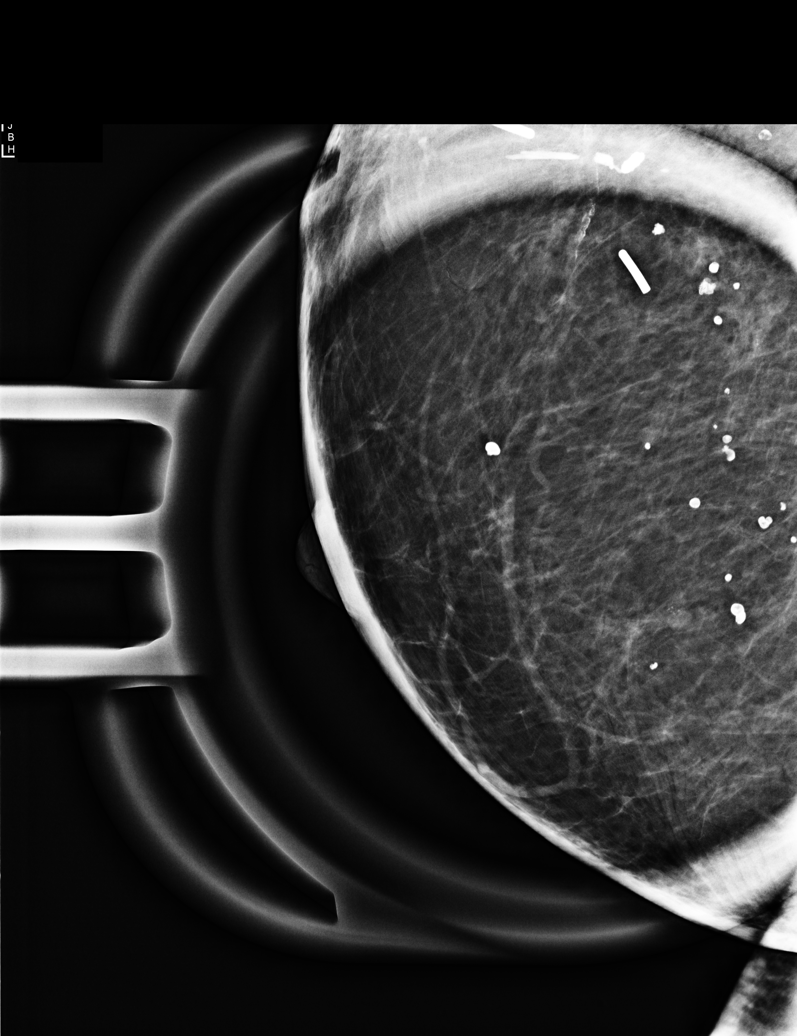

[R ML synth-2D]
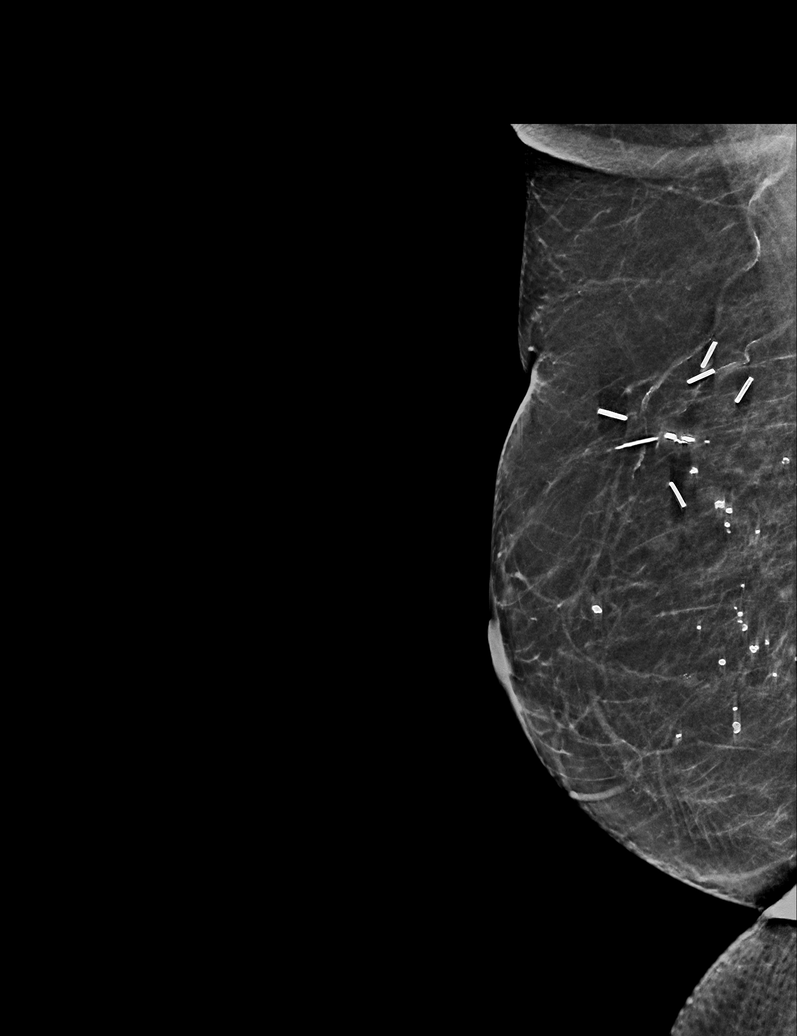

[R ML tomo · 2 of 52 frames shown]
[frame 17/52]
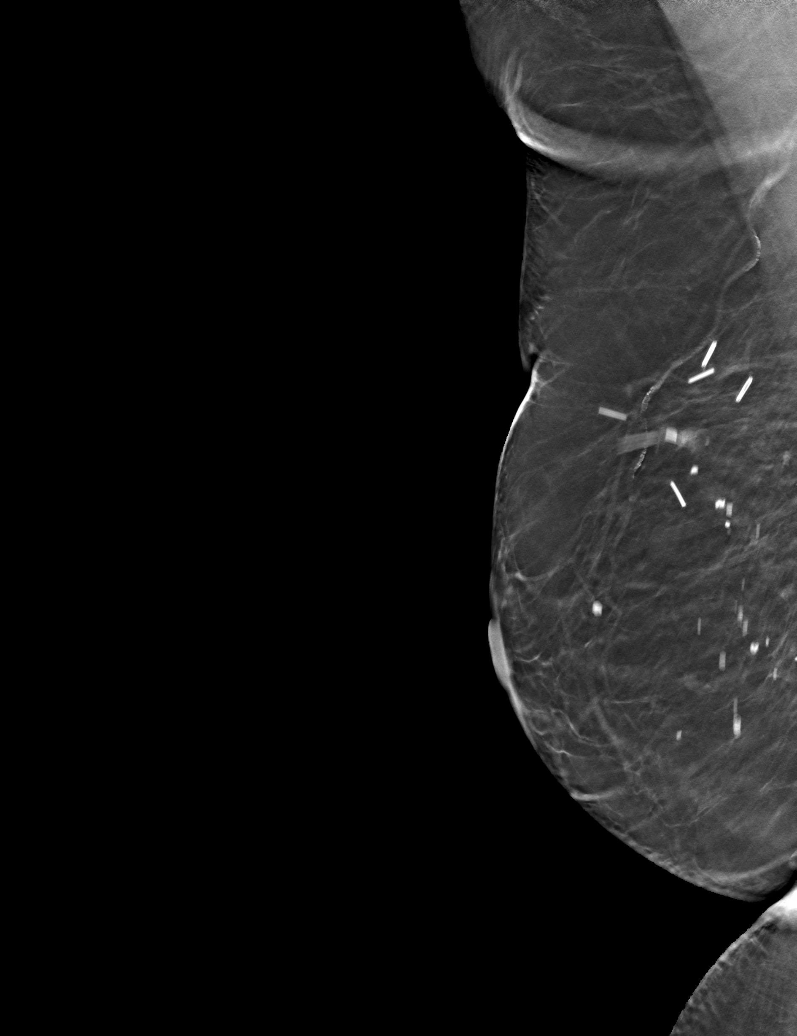
[frame 27/52]
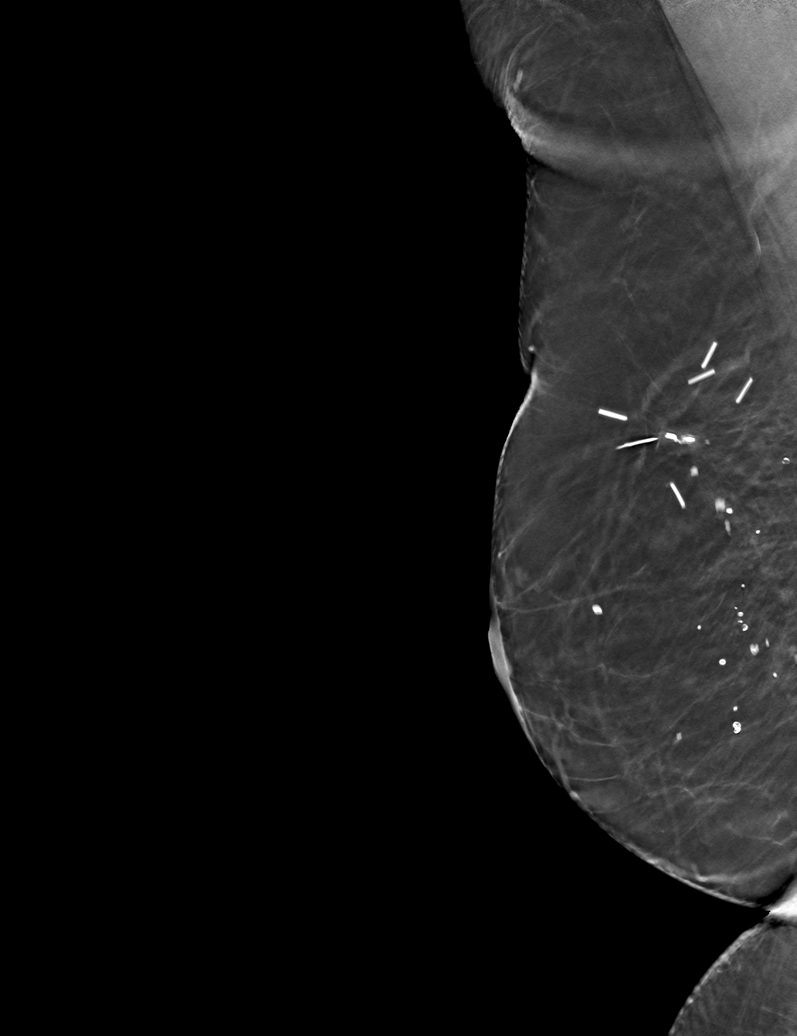

[6 of 9 positions shown; findings below may reference images not displayed]

ACR Breast Density Category b: There are scattered areas of
fibroglandular density.
FINDINGS: There is a 3-4 mm group of indeterminate, pleomorphic calcifications
with an associated focal asymmetry in the lower outer quadrant of
the right breast.

Mammographic images were processed with CAD.
IMPRESSION: Indeterminate right breast calcifications with possible associated
focal asymmetry. Recommendation is for stereotactic biopsy.

RECOMMENDATION:
Stereotactic biopsy of the right breast.

I have discussed the findings and recommendations with the patient.
If applicable, a reminder letter will be sent to the patient
regarding the next appointment.

BI-RADS CATEGORY  4: Suspicious.

## 2022-12-17 DIAGNOSIS — Z961 Presence of intraocular lens: Secondary | ICD-10-CM | POA: Diagnosis not present

## 2022-12-17 DIAGNOSIS — H26491 Other secondary cataract, right eye: Secondary | ICD-10-CM | POA: Diagnosis not present

## 2022-12-17 DIAGNOSIS — H353132 Nonexudative age-related macular degeneration, bilateral, intermediate dry stage: Secondary | ICD-10-CM | POA: Diagnosis not present

## 2022-12-19 DIAGNOSIS — I10 Essential (primary) hypertension: Secondary | ICD-10-CM | POA: Diagnosis not present

## 2022-12-25 ENCOUNTER — Inpatient Hospital Stay: Payer: HMO | Attending: Oncology | Admitting: Oncology

## 2022-12-25 ENCOUNTER — Encounter: Payer: Self-pay | Admitting: Oncology

## 2022-12-25 VITALS — BP 141/66 | HR 67 | Temp 97.2°F | Resp 16 | Ht 66.0 in | Wt 160.5 lb

## 2022-12-25 DIAGNOSIS — M8580 Other specified disorders of bone density and structure, unspecified site: Secondary | ICD-10-CM | POA: Insufficient documentation

## 2022-12-25 DIAGNOSIS — C50511 Malignant neoplasm of lower-outer quadrant of right female breast: Secondary | ICD-10-CM | POA: Insufficient documentation

## 2022-12-25 DIAGNOSIS — M25562 Pain in left knee: Secondary | ICD-10-CM | POA: Diagnosis not present

## 2022-12-25 DIAGNOSIS — Z79899 Other long term (current) drug therapy: Secondary | ICD-10-CM | POA: Insufficient documentation

## 2022-12-25 DIAGNOSIS — Z79811 Long term (current) use of aromatase inhibitors: Secondary | ICD-10-CM | POA: Diagnosis not present

## 2022-12-25 DIAGNOSIS — Z170 Estrogen receptor positive status [ER+]: Secondary | ICD-10-CM | POA: Insufficient documentation

## 2022-12-25 DIAGNOSIS — G8929 Other chronic pain: Secondary | ICD-10-CM | POA: Diagnosis not present

## 2022-12-25 DIAGNOSIS — I10 Essential (primary) hypertension: Secondary | ICD-10-CM | POA: Insufficient documentation

## 2022-12-25 NOTE — Progress Notes (Signed)
States her hair is thinning out and wonders if it is due to the aromasin.

## 2022-12-25 NOTE — Progress Notes (Signed)
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  INTERVAL HISTORY: Patient returns to clinic today for routine 36-month evaluation.  She continues to have chronic left knee pain which has helped with steroid injections.  She also has mild hair thinning.  She otherwise feels well and is asymptomatic.  She continues to remain active.  She has no neurologic complaints.  She denies any recent fevers or illnesses.  She has a good appetite and denies weight loss.  She has no chest pain, shortness of breath, cough, or hemoptysis.  She denies any nausea, vomiting, constipation, or diarrhea.  She has no urinary complaints.  Patient offers no further specific complaints today.  REVIEW OF SYSTEMS:   Review of Systems  Constitutional: Negative.  Negative for fever, malaise/fatigue and weight loss.  Respiratory: Negative.  Negative for cough, hemoptysis and shortness of breath.   Cardiovascular: Negative.  Negative for chest pain and leg swelling.  Gastrointestinal: Negative.  Negative for abdominal pain.  Genitourinary: Negative.  Negative for dysuria.  Musculoskeletal:  Positive for joint pain. Negative for back pain.  Skin: Negative.  Negative for rash.  Neurological: Negative.  Negative for dizziness, sensory change, focal weakness, weakness and headaches.  Psychiatric/Behavioral: Negative.  The patient is not nervous/anxious.     As per HPI. Otherwise, a complete review of systems is negative.  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   Family history of adverse reaction to anesthesia    brother - PONV   Fibrocystic breast    GERD (gastroesophageal reflux disease)    History of hiatal hernia 04/14/2018   HTN (hypertension)    Hyperlipidemia    IBS (irritable bowel syndrome)    Personal history of radiation therapy    PONV (postoperative nausea and vomiting)    Precordial pain    Recurrent breast cancer, right (HCC) 04/2020   ER/PR (+), HER2/neu equivocal invasive mammary carcinoma   Vertigo    (x1) over 10 yrs ago    PAST SURGICAL HISTORY: Past Surgical History:  Procedure Laterality Date   ABDOMINAL HYSTERECTOMY     age 34   BREAST BIOPSY Right 2003   +   BREAST BIOPSY Right 04/13/2020   calcs, Affirm Bx-[facility]/DCIS   BREAST LUMPECTOMY Right    f/u radiation    CATARACT EXTRACTION W/PHACO Right 08/06/2018   Procedure: CATARACT EXTRACTION PHACO AND INTRAOCULAR LENS PLACEMENT (IOC)  RIGHT;  Surgeon: Lockie Mola, MD;  Location: [facility];  Service: Ophthalmology;  Laterality: Right;   CATARACT EXTRACTION W/PHACO Left 08/27/2018   Procedure: CATARACT EXTRACTION PHACO AND INTRAOCULAR LENS PLACEMENT (IOC)  LEFT;  Surgeon: Lockie Mola, MD;  Location: [facility];  Service: Ophthalmology;  Laterality: Left;   COLONOSCOPY WITH PROPOFOL N/A 04/14/2018   Procedure: COLONOSCOPY WITH PROPOFOL;  Surgeon: Christena Deem, MD;  Location: [facility];  Service: Endoscopy;  Laterality: N/A;  ESOPHAGOGASTRODUODENOSCOPY (EGD) WITH PROPOFOL N/A 04/14/2018   Procedure: ESOPHAGOGASTRODUODENOSCOPY (EGD) WITH PROPOFOL;  Surgeon: Christena Deem, MD;  Location: [facility];  Service: Endoscopy;  Laterality: N/A;   EYE SURGERY     MASTECTOMY Right 05/04/2020   PARTIAL MASTECTOMY WITH AXILLARY  SENTINEL LYMPH NODE BIOPSY Right 05/04/2020   Procedure: TOTAL  MASTECTOMY WITH AXILLARY SENTINEL LYMPH NODE BIOPSY;  Surgeon: Carolan Shiver, MD;  Location: [facility];  Service: General;  Laterality: Right;    FAMILY HISTORY: Family History  Problem Relation Age of Onset   Breast cancer Neg Hx     ADVANCED DIRECTIVES (Y/N):  N  HEALTH MAINTENANCE: Social History   Tobacco Use   Smoking status: Never   Smokeless tobacco: Never  Vaping Use   Vaping status: Never Used  Substance Use Topics   Alcohol use: Not Currently   Drug use: Never     Colonoscopy:  PAP:  Bone density:  Lipid panel:  Allergies  Allergen Reactions   Azithromycin Nausea Only   Cefuroxime Axetil Nausea And Vomiting   Doxycycline Nausea Only    Thrush    Levaquin [Levofloxacin] Nausea Only   Lisinopril     Hypotension    Other Hives    Pneumovac    Penicillin V Potassium Swelling    Childhood reaction - arm swelling   Sulfa Antibiotics Nausea And Vomiting   Naproxen Rash    Current Outpatient Medications  Medication Sig Dispense Refill   acetaminophen (TYLENOL) 325 MG tablet Take 650 mg by mouth every 6 (six) hours as needed.     amLODipine (NORVASC) 10 MG tablet Take 10 mg by mouth daily.     aspirin 81 MG chewable tablet Chew 81 mg by mouth daily.     Calcium Carb-Cholecalciferol (CALCIUM 500 + D PO) Take 1 tablet by mouth daily.     Carboxymethylcellul-Glycerin (REFRESH RELIEVA OP) Place 1 drop into both eyes 2 (two) times daily as needed (dry eyes).     cetirizine (ZYRTEC) 10 MG tablet Take 10 mg by mouth daily as needed for allergies.     Cholecalciferol 25 MCG (1000 UT) TBDP Take 1,000 Units by mouth daily.     exemestane (AROMASIN) 25 MG tablet TAKE 1 TABLET BY MOUTH DAILY AFTER BREAKFAST 90 tablet 3   Fluocinolone Acetonide Scalp (DERMA-SMOOTHE/FS SCALP) 0.01 % OIL Apply to aa of scalp once weekly cover with shower cap wash out in morning 118.28 mL 4   hydrochlorothiazide  (HYDRODIURIL) 12.5 MG tablet Take 12.5 mg by mouth daily.     lidocaine-prilocaine (EMLA) cream Apply topically.     losartan (COZAAR) 50 MG tablet Take 50 mg by mouth daily. 1.5 tab in morning     meclizine (ANTIVERT) 25 MG tablet Take 25 mg by mouth 3 (three) times daily as needed for dizziness.     Multiple Vitamins-Minerals (CENTRUM SILVER PO) Take 1 tablet by mouth daily.     Multiple Vitamins-Minerals (PRESERVISION AREDS 2) CAPS Take 1 tablet by mouth 2 (two) times daily.     pantoprazole (PROTONIX) 40 MG tablet Take 40 mg by mouth daily.     simvastatin (ZOCOR) 20 MG tablet Take 20 mg by mouth daily.     sodium chloride (OCEAN) 0.65 % nasal spray Place 1 spray into the nose 2 (two) times daily as needed for congestion.     fluocinonide (LIDEX) 0.05 % external solution Apply 1 Application topically at bedtime as needed (scalp dryness). (Patient not taking: Reported on 12/25/2022)  60 mL 4   guaiFENesin (MUCINEX) 600 MG 12 hr tablet Take 600 mg by mouth 2 (two) times daily as needed for to loosen phlegm. (Patient not taking: Reported on 12/25/2022)     No current facility-administered medications for this visit.    OBJECTIVE: Vitals:   12/25/22 1303  BP: (!) 141/66  Pulse: 67  Resp: 16  Temp: (!) 97.2 F (36.2 C)  SpO2: 98%     Body mass index is 25.91 kg/m.    ECOG FS:0 - Asymptomatic  General: Well-developed, well-nourished, no acute distress. Eyes: Pink conjunctiva, anicteric sclera. HEENT: Normocephalic, moist mucous membranes. Lungs: No audible wheezing or coughing. Heart: Regular rate and rhythm. Abdomen: Soft, nontender, no obvious distention. Musculoskeletal: No edema, cyanosis, or clubbing. Neuro: Alert, answering all questions appropriately. Cranial nerves grossly intact. Skin: No rashes or petechiae noted. Psych: Normal affect.  LAB RESULTS:  Lab Results  Component Value Date   NA 139 06/19/2022   K 3.4 (L) 06/19/2022   CL 101 06/19/2022   CO2 29 06/19/2022    GLUCOSE 116 (H) 06/19/2022   BUN 24 (H) 06/19/2022   CREATININE 0.94 06/19/2022   CALCIUM 9.2 06/19/2022   PROT 7.4 06/19/2022   ALBUMIN 4.6 06/19/2022   AST 21 06/19/2022   ALT 22 06/19/2022   ALKPHOS 59 06/19/2022   BILITOT 0.9 06/19/2022   GFRNONAA >60 06/19/2022    Lab Results  Component Value Date   WBC 7.9 06/19/2022   NEUTROABS 5.1 06/19/2022   HGB 14.0 06/19/2022   HCT 41.8 06/19/2022   MCV 93.3 06/19/2022   PLT 267 06/19/2022     STUDIES: No results found.  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  Given the small size of her malignancy she did not require Oncotype testing and chemotherapy was not necessary.  She did not require adjuvant XRT.  Letrozole was discontinued secondary to side effects and patient was placed back on Aromasin.  Continue total 5 years of treatment completing in March 2027.  Her most recent left screening mammogram in March 13, 2022 was reported as BI-RADS 1.  Repeat mammogram in January 2025.  Return to clinic in 6 months for routine evaluation. Osteopenia: Patient's most recent bone mineral density on June 12, 2021 reported T-score of -1.6 which was unchanged from 1 year prior.  Continue calcium and vitamin D supplementation.  Repeat in January 2025 along with mammogram as above. Joint pain: Continue follow-up with orthopedics as needed. Hypertension: Patient's blood pressure is moderately elevated today.  Continue follow-up and treatment per primary care.  Patient reports that she has an appointment tomorrow.  Patient expressed understanding and was in agreement with this plan. She also understands that She can call clinic at any time with any questions, concerns, or  complaints.    Cancer Staging  Primary cancer of lower-outer quadrant of right breast [facility]) Staging form: Breast, AJCC 8th Edition - Pathologic stage from 05/20/2020: Stage IA (pT1a, pN0, cM0, G1, ER+, PR+, HER2-) - Signed by Jeralyn Ruths, MD on 05/20/2020 Stage prefix: Initial diagnosis Histologic grading system: 3 grade system   Jeralyn Ruths, MD   12/25/2022 1:24 PM

## 2022-12-26 DIAGNOSIS — E782 Mixed hyperlipidemia: Secondary | ICD-10-CM | POA: Diagnosis not present

## 2022-12-26 DIAGNOSIS — Z23 Encounter for immunization: Secondary | ICD-10-CM | POA: Diagnosis not present

## 2022-12-26 DIAGNOSIS — I10 Essential (primary) hypertension: Secondary | ICD-10-CM | POA: Diagnosis not present

## 2022-12-26 DIAGNOSIS — C50919 Malignant neoplasm of unspecified site of unspecified female breast: Secondary | ICD-10-CM | POA: Diagnosis not present

## 2022-12-26 DIAGNOSIS — K589 Irritable bowel syndrome without diarrhea: Secondary | ICD-10-CM | POA: Diagnosis not present

## 2022-12-26 DIAGNOSIS — Z0001 Encounter for general adult medical examination with abnormal findings: Secondary | ICD-10-CM | POA: Diagnosis not present

## 2023-01-25 DIAGNOSIS — M1712 Unilateral primary osteoarthritis, left knee: Secondary | ICD-10-CM | POA: Diagnosis not present

## 2023-02-05 ENCOUNTER — Ambulatory Visit: Payer: HMO | Admitting: Dermatology

## 2023-02-05 ENCOUNTER — Encounter: Payer: Self-pay | Admitting: Dermatology

## 2023-02-05 DIAGNOSIS — L578 Other skin changes due to chronic exposure to nonionizing radiation: Secondary | ICD-10-CM | POA: Diagnosis not present

## 2023-02-05 DIAGNOSIS — L409 Psoriasis, unspecified: Secondary | ICD-10-CM | POA: Diagnosis not present

## 2023-02-05 DIAGNOSIS — D2371 Other benign neoplasm of skin of right lower limb, including hip: Secondary | ICD-10-CM | POA: Diagnosis not present

## 2023-02-05 DIAGNOSIS — L821 Other seborrheic keratosis: Secondary | ICD-10-CM

## 2023-02-05 DIAGNOSIS — L817 Pigmented purpuric dermatosis: Secondary | ICD-10-CM | POA: Diagnosis not present

## 2023-02-05 DIAGNOSIS — L570 Actinic keratosis: Secondary | ICD-10-CM | POA: Diagnosis not present

## 2023-02-05 DIAGNOSIS — I83893 Varicose veins of bilateral lower extremities with other complications: Secondary | ICD-10-CM

## 2023-02-05 DIAGNOSIS — D229 Melanocytic nevi, unspecified: Secondary | ICD-10-CM | POA: Diagnosis not present

## 2023-02-05 DIAGNOSIS — Z1283 Encounter for screening for malignant neoplasm of skin: Secondary | ICD-10-CM

## 2023-02-05 DIAGNOSIS — Z7189 Other specified counseling: Secondary | ICD-10-CM

## 2023-02-05 DIAGNOSIS — M898X8 Other specified disorders of bone, other site: Secondary | ICD-10-CM

## 2023-02-05 DIAGNOSIS — D1801 Hemangioma of skin and subcutaneous tissue: Secondary | ICD-10-CM

## 2023-02-05 DIAGNOSIS — Z85828 Personal history of other malignant neoplasm of skin: Secondary | ICD-10-CM

## 2023-02-05 DIAGNOSIS — W908XXA Exposure to other nonionizing radiation, initial encounter: Secondary | ICD-10-CM | POA: Diagnosis not present

## 2023-02-05 DIAGNOSIS — M898X9 Other specified disorders of bone, unspecified site: Secondary | ICD-10-CM

## 2023-02-05 DIAGNOSIS — L820 Inflamed seborrheic keratosis: Secondary | ICD-10-CM

## 2023-02-05 DIAGNOSIS — D2239 Melanocytic nevi of other parts of face: Secondary | ICD-10-CM

## 2023-02-05 DIAGNOSIS — L814 Other melanin hyperpigmentation: Secondary | ICD-10-CM | POA: Diagnosis not present

## 2023-02-05 NOTE — Progress Notes (Signed)
Follow-Up Visit   Subjective  Cheryl Morris is a 82 y.o. female who presents for the following: Skin Cancer Screening and Full Body Skin Exam. Hx of BCC. Spot on right lower leg. Noticed recently. Itchy spot on lower back.  Pt has scalp psoriasis- uses topical treatments.  The patient presents for Total-Body Skin Exam (TBSE) for skin cancer screening and mole check. The patient has spots, moles and lesions to be evaluated, some may be new or changing and the patient may have concern these could be cancer.    The following portions of the chart were reviewed this encounter and updated as appropriate: medications, allergies, medical history  Review of Systems:  No other skin or systemic complaints except as noted in HPI or Assessment and Plan.  Objective  Well appearing patient in no apparent distress; mood and affect are within normal limits.  A full examination was performed including scalp, head, eyes, ears, nose, lips, neck, chest, axillae, abdomen, back, buttocks, bilateral upper extremities, bilateral lower extremities, hands, feet, fingers, toes, fingernails, and toenails. All findings within normal limits unless otherwise noted below.   Relevant physical exam findings are noted in the Assessment and Plan.  spinal lower back x3 (3) Erythematous keratotic or waxy stuck-on papule  central upper forehead x1 Erythematous thin papules/macules with gritty scale.     Assessment & Plan   HISTORY OF BASAL CELL CARCINOMA OF THE SKIN. Left nasal dorsum. Mohs 01/04/2020. - No evidence of recurrence today - Recommend regular full body skin exams - Recommend daily broad spectrum sunscreen SPF 30+ to sun-exposed areas, reapply every 2 hours as needed.  - Call if any new or changing lesions are noted between office visits   SKIN CANCER SCREENING PERFORMED TODAY.  ACTINIC DAMAGE. Chest.  - Chronic condition, secondary to cumulative UV/sun exposure - diffuse scaly  erythematous macules with underlying dyspigmentation - Recommend daily broad spectrum sunscreen SPF 30+ to sun-exposed areas, reapply every 2 hours as needed.  - Staying in the shade or wearing long sleeves, sun glasses (UVA+UVB protection) and wide brim hats (4-inch brim around the entire circumference of the hat) are also recommended for sun protection.  - Call for new or changing lesions.  LENTIGINES, SEBORRHEIC KERATOSES, HEMANGIOMAS - Benign normal skin lesions. Back, chest - Benign-appearing, including SK at R medial lower leg - Call for any changes  MELANOCYTIC NEVI - Tan-brown and/or pink-flesh-colored symmetric macules and papules, including right anterior jaw 4.0 mm flesh papule with emerging hair  - Benign appearing on exam today - Observation - Call clinic for new or changing moles - Recommend daily use of broad spectrum spf 30+ sunscreen to sun-exposed areas.   PSORIASIS Exam: pink scaly plaque at occipital scalp  2% BSA.  Chronic and persistent condition with duration or expected duration over one year. Condition is improving with treatment but not currently at goal.   patient denies joint pain  Counseling on psoriasis and coordination of care  psoriasis is a chronic non-curable, but treatable genetic/hereditary disease that may have other systemic features affecting other organ systems such as joints (Psoriatic Arthritis). It is associated with an increased risk of inflammatory bowel disease, heart disease, non-alcoholic fatty liver disease, and depression.  Treatments include light and laser treatments; topical medications; and systemic medications including oral and injectables.  Treatment Plan: Continue T-Sal shampoo uses 1 x weekly  Continue fluocinonide solution 1-2 times daily until itchy rash cleared and prn flares. Continue  Derma-Smoothe fs scalp oil -  prior to hair appointment apply to aa scalp once weekly cover with shower cap overnight then wash out in  morning.     Fibrous Papule - left nasal tip 2.5 mm pink flesh colored papule  - Benign-appearing. Stable compared to previous visit. Observation.  Call clinic for new or changing moles.  Recommend daily use of broad spectrum spf 30+ sunscreen to sun-exposed areas.    DERMATOFIBROMA Exam: Firm pink/brown papulenodule with dimple sign right upper knee. Treatment Plan: A dermatofibroma is a benign growth possibly related to trauma, such as an insect bite, cut from shaving, or inflamed acne-type bump.  Treatment options to remove include shave or excision with resulting scar and risk of recurrence.  Since benign-appearing and not bothersome, will observe for now.   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 Edema at B/L lower legs.  - Reassured - Smaller vessels can be treated by sclerotherapy (a procedure to inject a medicine into the veins to make them disappear) if desired, but the treatment is not covered by insurance. Larger vessels may be covered if symptomatic and we would refer to vascular surgeon if treatment desired.  Bony Prominence   Exam: 3 cm firm subcutaneous bony nodule at right frontal scalp.  Patient reports area has been x-rayed 2-3 years ago. No abnormalities seen. Present since 57-59 years of age without changes.   Treatment: Benign-appearing.  Observation.  Call clinic for new or changing lesions.  Recommend daily use of broad spectrum spf 30+ sunscreen to sun-exposed areas.    Inflamed seborrheic keratosis (3) spinal lower back x3  Symptomatic, irritating, patient would like treated.  Destruction of lesion - spinal lower back x3 (3)  Destruction method: cryotherapy   Informed consent: discussed and consent obtained   Lesion destroyed using liquid nitrogen: Yes   Region frozen until ice ball extended beyond lesion: Yes   Outcome: patient tolerated  procedure well with no complications   Post-procedure details: wound care instructions given   Additional details:  Prior to procedure, discussed risks of blister formation, small wound, skin dyspigmentation, or rare scar following cryotherapy. Recommend Vaseline ointment to treated areas while healing.   AK (actinic keratosis) central upper forehead x1  Actinic keratoses are precancerous spots that appear secondary to cumulative UV radiation exposure/sun exposure over time. They are chronic with expected duration over 1 year. A portion of actinic keratoses will progress to squamous cell carcinoma of the skin. It is not possible to reliably predict which spots will progress to skin cancer and so treatment is recommended to prevent development of skin cancer.  Recommend daily broad spectrum sunscreen SPF 30+ to sun-exposed areas, reapply every 2 hours as needed.  Recommend staying in the shade or wearing long sleeves, sun glasses (UVA+UVB protection) and wide brim hats (4-inch brim around the entire circumference of the hat). Call for new or changing lesions.  Destruction of lesion - central upper forehead x1  Destruction method: cryotherapy   Informed consent: discussed and consent obtained   Lesion destroyed using liquid nitrogen: Yes   Region frozen until ice ball extended beyond lesion: Yes   Outcome: patient tolerated procedure well with no complications   Post-procedure details: wound care instructions given   Additional details:  Prior to procedure, discussed risks of blister formation, small wound, skin dyspigmentation, or rare scar following cryotherapy. Recommend Vaseline ointment to treated areas while healing.    Return in  about 1 year (around 02/05/2024) for TBSE, HxBCC.  I, Lawson Radar, CMA, am acting as scribe for Willeen Niece, MD.   Documentation: I have reviewed the above documentation for accuracy and completeness, and I agree with the above.  Willeen Niece, MD

## 2023-02-05 NOTE — Patient Instructions (Addendum)
Cryotherapy Aftercare  Wash gently with soap and water everyday.   Apply Vaseline and Band-Aid daily until healed.    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 Continue  DermaSmoothe fs scalp oil - prior to hair appointment apply to aa scalp once weekly cover with shower cap overnight then wash out in morning.  Psoriasis is a chronic non-curable, but treatable genetic/hereditary disease that may have other systemic features affecting other organ systems such as joints (Psoriatic Arthritis). It is associated with an increased risk of inflammatory bowel disease, heart disease, non-alcoholic fatty liver disease, and depression.  Treatments include light and laser treatments; topical medications; and systemic medications including oral and injectables.    Gentle Skin Care Guide  1. Bathe no more than once a day.  2. Avoid bathing in hot water  3. Use a mild soap like Dove, Vanicream, Cetaphil, CeraVe. Can use Lever 2000 or Cetaphil antibacterial soap  4. Use soap only where you need it. On most days, use it under your arms, between your legs, and on your feet. Let the water rinse other areas unless visibly dirty.  5. When you get out of the bath/shower, use a towel to gently blot your skin dry, don't rub it.  6. While your skin is still a little damp, apply a moisturizing cream such as Vanicream, CeraVe, Cetaphil, Eucerin Advanced, Sarna lotion or plain Vaseline Jelly. For hands apply Neutrogena [place] Hand Cream or Excipial Hand Cream.  7. Reapply moisturizer any time you start to itch or feel dry.  8. Sometimes using free and clear laundry detergents can be helpful. Fabric softener sheets should be avoided. Downy Free & Gentle liquid, or any liquid fabric softener that is free of dyes and perfumes, it acceptable to use  9. If your doctor has given you prescription creams you may  apply moisturizers over them        Recommend daily broad spectrum sunscreen SPF 30+ to sun-exposed areas, reapply every 2 hours as needed. Call for new or changing lesions.  Staying in the shade or wearing long sleeves, sun glasses (UVA+UVB protection) and wide brim hats (4-inch brim around the entire circumference of the hat) are also recommended for sun protection.      Melanoma ABCDEs  Melanoma is the most dangerous type of skin cancer, and is the leading cause of death from skin disease.  You are more likely to develop melanoma if you: Have light-colored skin, light-colored eyes, or red or blond hair Spend a lot of time in the sun Tan regularly, either outdoors or in a tanning bed Have had blistering sunburns, especially during childhood Have a close family member who has had a melanoma Have atypical moles or large birthmarks  Early detection of melanoma is key since treatment is typically straightforward and cure rates are extremely high if we catch it early.   The first sign of melanoma is often a change in a mole or a new dark spot.  The ABCDE system is a way of remembering the signs of melanoma.  A for asymmetry:  The two halves do not match. B for border:  The edges of the growth are irregular. C for color:  A mixture of colors are present instead of an even brown color. D for diameter:  Melanomas are usually (but not always) greater than 6mm - the size of a pencil eraser. E for evolution:  The spot keeps changing in size, shape, and color.  Please check your skin once per month between visits. You can use a small mirror in front and a large mirror behind you to keep an eye on the back side or your body.   If you see any new or changing lesions before your next follow-up, please call to schedule a visit.  Please continue daily skin protection including broad spectrum sunscreen SPF 30+ to sun-exposed areas, reapplying every 2 hours as needed when you're outdoors.    Staying in the shade or wearing long sleeves, sun glasses (UVA+UVB protection) and wide brim hats (4-inch brim around the entire circumference of the hat) are also recommended for sun protection.       Due to recent changes in healthcare laws, you may see results of your pathology and/or laboratory studies on MyChart before the doctors have had a chance to review them. We understand that in some cases there may be results that are confusing or concerning to you. Please understand that not all results are received at the same time and often the doctors may need to interpret multiple results in order to provide you with the best plan of care or course of treatment. Therefore, we ask that you please give [place] 2 business days to thoroughly review all your results before contacting the office for clarification. Should we see a critical lab result, you will be contacted sooner.   If You Need Anything After Your Visit  If you have any questions or concerns for your doctor, please call our main line at [phone] and press option 4 to reach your doctor's medical assistant. If no one answers, please leave a voicemail as directed and we will return your call as soon as possible. Messages left after 4 pm will be answered the following business day.   You may also send [place] a message via MyChart. We typically respond to MyChart messages within 1-2 business days.  For prescription refills, please ask your pharmacy to contact our office. Our fax number is [phone].  If you have an urgent issue when the clinic is closed that cannot wait until the next business day, you can page your doctor at the number below.    Please note that while we do our best to be available for urgent issues outside of office hours, we are not available 24/7.   If you have an urgent issue and are unable to reach [place], you may choose to seek medical care at your doctor's office, retail clinic, urgent care center, or emergency  room.  If you have a medical emergency, please immediately call 911 or go to the emergency department.  Pager Numbers  - Dr. Gwen Pounds: [phone]  - Dr. Roseanne Reno: [phone]  - Dr. Katrinka Blazing: [phone]   In the event of inclement weather, please call our main line at [phone] for an update on the status of any delays or closures.  Dermatology Medication Tips: Please keep the boxes that topical medications come in in order to help keep track of the instructions about where and how to use these. Pharmacies typically print the medication instructions only on the boxes and not directly on the medication tubes.   If your medication is too expensive, please contact our office at [phone] option 4 or send [place] a message through MyChart.   We are unable to tell what your co-pay for medications will be in advance as this is different depending on your insurance coverage. However, we may  be able to find a substitute medication at lower cost or fill out paperwork to get insurance to cover a needed medication.   If a prior authorization is required to get your medication covered by your insurance company, please allow [place] 1-2 business days to complete this process.  Drug prices often vary depending on where the prescription is filled and some pharmacies may offer cheaper prices.  The website www.goodrx.com contains coupons for medications through different pharmacies. The prices here do not account for what the cost may be with help from insurance (it may be cheaper with your insurance), but the website can give you the price if you did not use any insurance.  - You can print the associated coupon and take it with your prescription to the pharmacy.  - You may also stop by our office during regular business hours and pick up a GoodRx coupon card.  - If you need your prescription sent electronically to a different pharmacy, notify our office through [facility] or by phone at  [phone] option 4.     Si Usted Necesita Algo Despus de Su Visita  Tambin puede enviarnos un mensaje a travs de Clinical cytogeneticist. Por lo general respondemos a los mensajes de MyChart en el transcurso de 1 a 2 das hbiles.  Para renovar recetas, por favor pida a su farmacia que se ponga en contacto con nuestra oficina. Annie Sable de fax es [place] [phone].  Si tiene un asunto urgente cuando la clnica est cerrada y que no puede esperar hasta el siguiente da hbil, puede llamar/localizar a su doctor(a) al nmero que aparece a continuacin.   Por favor, tenga en cuenta que aunque hacemos todo lo posible para estar disponibles para asuntos urgentes fuera del horario de [place], no estamos disponibles las 24 horas del da, los 7 [address]  [address] de la [place].   Si tiene un problema urgente y no puede comunicarse con nosotros, puede optar por buscar atencin mdica  en el consultorio de su doctor(a), en una clnica privada, en un centro de atencin urgente o en una sala de emergencias.  Si tiene Engineer, drilling, por favor llame inmediatamente al 911 o vaya a la sala de emergencias.  Nmeros de bper  - Dr. Gwen Pounds: [phone]  - Dra. Roseanne Reno: [phone]  - Dr. Katrinka Blazing: [phone]   En caso de inclemencias del tiempo, por favor llame a Lacy Duverney principal al [phone] para una actualizacin sobre el [place] de cualquier retraso o cierre.  Consejos para la medicacin en dermatologa: Por favor, guarde las cajas en las que vienen los medicamentos de uso tpico para ayudarle a seguir las instrucciones sobre dnde y cmo usarlos. Las farmacias generalmente imprimen las instrucciones del medicamento slo en las cajas y no directamente en los tubos del [place].   Si su medicamento es muy caro, por favor, pngase en contacto con Rolm Gala llamando al [phone] y presione la opcin 4 o envenos un mensaje a travs de Clinical cytogeneticist.   No podemos decirle cul ser su copago por los  medicamentos por adelantado ya que esto es diferente dependiendo de la cobertura de su seguro. Sin embargo, es posible que podamos encontrar un medicamento sustituto a Audiological scientist un formulario para que el seguro cubra el medicamento que se considera necesario.   Si se requiere una autorizacin previa para que su compaa de seguros [place] su medicamento, por favor permtanos de 1 a 2 das hbiles para completar [address]  Los precios de los [employer]  varan con frecuencia dependiendo del lugar de dnde se surte la receta y alguna farmacias pueden ofrecer precios ms baratos.  El sitio web www.goodrx.com tiene cupones para medicamentos de Health and safety inspector. Los precios aqu no tienen en cuenta lo que podra costar con la ayuda del seguro (puede ser ms barato con su seguro), pero el sitio web puede darle el precio si no utiliz Tourist information centre manager.  - Puede imprimir el cupn correspondiente y llevarlo con su receta a la farmacia.  - Tambin puede pasar por nuestra oficina durante el horario de atencin regular y Education officer, museum una tarjeta de cupones de GoodRx.  - Si necesita que su receta se enve electrnicamente a una farmacia diferente, informe a nuestra oficina a travs de MyChart de [place] o por telfono llamando al [phone] y presione la opcin 4.

## 2023-02-11 DIAGNOSIS — R0602 Shortness of breath: Secondary | ICD-10-CM | POA: Diagnosis not present

## 2023-02-11 DIAGNOSIS — I10 Essential (primary) hypertension: Secondary | ICD-10-CM | POA: Diagnosis not present

## 2023-02-11 DIAGNOSIS — E782 Mixed hyperlipidemia: Secondary | ICD-10-CM | POA: Diagnosis not present

## 2023-02-15 DIAGNOSIS — M1711 Unilateral primary osteoarthritis, right knee: Secondary | ICD-10-CM | POA: Diagnosis not present

## 2023-04-01 ENCOUNTER — Ambulatory Visit
Admission: RE | Admit: 2023-04-01 | Discharge: 2023-04-01 | Disposition: A | Discharge status: Home / Self Care | Payer: HMO | Source: Ambulatory Visit | Attending: Oncology | Admitting: Oncology

## 2023-04-01 DIAGNOSIS — Z780 Asymptomatic menopausal state: Secondary | ICD-10-CM | POA: Diagnosis not present

## 2023-04-01 DIAGNOSIS — C50511 Malignant neoplasm of lower-outer quadrant of right female breast: Secondary | ICD-10-CM | POA: Insufficient documentation

## 2023-04-01 DIAGNOSIS — Z853 Personal history of malignant neoplasm of breast: Secondary | ICD-10-CM | POA: Diagnosis not present

## 2023-04-01 DIAGNOSIS — Z79811 Long term (current) use of aromatase inhibitors: Secondary | ICD-10-CM | POA: Insufficient documentation

## 2023-04-01 DIAGNOSIS — Z1231 Encounter for screening mammogram for malignant neoplasm of breast: Secondary | ICD-10-CM | POA: Diagnosis not present

## 2023-04-09 DIAGNOSIS — C50511 Malignant neoplasm of lower-outer quadrant of right female breast: Secondary | ICD-10-CM | POA: Diagnosis not present

## 2023-05-23 DIAGNOSIS — Z124 Encounter for screening for malignant neoplasm of cervix: Secondary | ICD-10-CM | POA: Diagnosis not present

## 2023-05-23 DIAGNOSIS — Z853 Personal history of malignant neoplasm of breast: Secondary | ICD-10-CM | POA: Diagnosis not present

## 2023-06-13 DIAGNOSIS — J019 Acute sinusitis, unspecified: Secondary | ICD-10-CM | POA: Diagnosis not present

## 2023-06-19 DIAGNOSIS — I10 Essential (primary) hypertension: Secondary | ICD-10-CM | POA: Diagnosis not present

## 2023-06-25 ENCOUNTER — Inpatient Hospital Stay: Payer: HMO | Attending: Oncology | Admitting: Oncology

## 2023-06-25 VITALS — BP 143/50 | HR 65 | Temp 98.5°F | Resp 16 | Wt 153.3 lb

## 2023-06-25 DIAGNOSIS — Z9011 Acquired absence of right breast and nipple: Secondary | ICD-10-CM | POA: Insufficient documentation

## 2023-06-25 DIAGNOSIS — Z170 Estrogen receptor positive status [ER+]: Secondary | ICD-10-CM | POA: Diagnosis not present

## 2023-06-25 DIAGNOSIS — I10 Essential (primary) hypertension: Secondary | ICD-10-CM | POA: Insufficient documentation

## 2023-06-25 DIAGNOSIS — Z1732 Human epidermal growth factor receptor 2 negative status: Secondary | ICD-10-CM | POA: Insufficient documentation

## 2023-06-25 DIAGNOSIS — Z79811 Long term (current) use of aromatase inhibitors: Secondary | ICD-10-CM | POA: Diagnosis not present

## 2023-06-25 DIAGNOSIS — Z79899 Other long term (current) drug therapy: Secondary | ICD-10-CM | POA: Insufficient documentation

## 2023-06-25 DIAGNOSIS — Z1721 Progesterone receptor positive status: Secondary | ICD-10-CM | POA: Diagnosis not present

## 2023-06-25 DIAGNOSIS — C50511 Malignant neoplasm of lower-outer quadrant of right female breast: Secondary | ICD-10-CM | POA: Insufficient documentation

## 2023-06-25 NOTE — Progress Notes (Signed)
 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  INTERVAL HISTORY: Patient returns to clinic today for routine 70-month evaluation.  She currently feels well and is asymptomatic.  She continues to remain active.  Her only complaint today is of continued hair thinning.  She otherwise is tolerating Aromasin without significant side effects.  She has no neurologic complaints.  She has had a recent sinus infection, but this has since resolved.  She has a good appetite and denies weight loss.  She has no chest pain, shortness of breath, cough, or hemoptysis.  She denies any nausea, vomiting, constipation, or diarrhea.  She has no urinary complaints.  Patient off no specific complaints today.  REVIEW OF SYSTEMS:   Review of Systems  Constitutional: Negative.  Negative for fever, malaise/fatigue and weight loss.  Respiratory: Negative.  Negative for cough, hemoptysis and shortness of breath.   Cardiovascular: Negative.  Negative for chest pain and leg swelling.  Gastrointestinal: Negative.  Negative for abdominal pain.  Genitourinary: Negative.  Negative for dysuria.  Musculoskeletal: Negative.  Negative for back pain and joint pain.  Skin: Negative.  Negative for rash.  Neurological: Negative.  Negative for dizziness, sensory change, focal weakness, weakness and headaches.  Psychiatric/Behavioral: Negative.  The patient is not nervous/anxious.     As per HPI. Otherwise, a complete review of systems is negative.  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   Family history of adverse reaction to anesthesia    brother - PONV   Fibrocystic breast    GERD (gastroesophageal reflux disease)    History of hiatal hernia 04/14/2018   HTN (hypertension)    Hyperlipidemia    IBS (irritable bowel syndrome)    Personal history of radiation therapy    PONV (postoperative nausea and vomiting)    Precordial pain    Recurrent breast cancer, right (HCC) 04/2020   ER/PR (+), HER2/neu equivocal invasive mammary carcinoma   Vertigo    (x1) over 10 yrs ago    PAST SURGICAL HISTORY: Past Surgical History:  Procedure Laterality Date   ABDOMINAL HYSTERECTOMY     age 89   BREAST BIOPSY Right 2003   +   BREAST BIOPSY Right 04/13/2020   calcs, Affirm Bx-IMC/DCIS   BREAST LUMPECTOMY Right    f/u radiation    CATARACT EXTRACTION W/PHACO Right 08/06/2018   Procedure: CATARACT EXTRACTION PHACO AND INTRAOCULAR LENS PLACEMENT (IOC)  RIGHT;  Surgeon: Lockie Mola, MD;  Location: [facility];  Service: Ophthalmology;  Laterality: Right;   CATARACT EXTRACTION W/PHACO Left 08/27/2018   Procedure: CATARACT EXTRACTION PHACO AND INTRAOCULAR LENS PLACEMENT (IOC)  LEFT;  Surgeon: Lockie Mola, MD;  Location: [facility];  Service: Ophthalmology;  Laterality: Left;   COLONOSCOPY WITH PROPOFOL N/A 04/14/2018   Procedure: COLONOSCOPY WITH PROPOFOL;  Surgeon: Christena Deem, MD;  Location: [facility];  Service: Endoscopy;  Laterality: N/A;   ESOPHAGOGASTRODUODENOSCOPY (EGD) WITH PROPOFOL N/A 04/14/2018   Procedure: ESOPHAGOGASTRODUODENOSCOPY (EGD) WITH PROPOFOL;  Surgeon: Christena Deem, MD;  Location: [facility] END[facility];  Service: Endoscopy;  Laterality: N/A;   EYE SURGERY     MASTECTOMY Right 05/04/2020   PARTIAL MASTECTOMY  WITH AXILLARY SENTINEL LYMPH NODE BIOPSY Right 05/04/2020   Procedure: TOTAL  MASTECTOMY WITH AXILLARY SENTINEL LYMPH NODE BIOPSY;  Surgeon: Carolan Shiver, MD;  Location: [facility] OR[facility];  Service: General;  Laterality: Right;    FAMILY HISTORY: Family History  Problem Relation Age of Onset   Breast cancer Neg Hx     ADVANCED DIRECTIVES (Y/N):  N  HEALTH MAINTENANCE: Social History   Tobacco Use   Smoking status: Never   Smokeless tobacco: Never  Vaping Use   Vaping status: Never Used  Substance Use Topics   Alcohol use: Not Currently   Drug use: Never     Colonoscopy:  PAP:  Bone density:  Lipid panel:  Allergies  Allergen Reactions   Azithromycin Nausea Only   Cefuroxime Axetil Nausea And Vomiting   Doxycycline Nausea Only    Thrush    Levaquin [Levofloxacin] Nausea Only   Lisinopril     Hypotension    Other Hives    Pneumovac    Penicillin V Potassium Swelling    Childhood reaction - arm swelling   Sulfa Antibiotics Nausea And Vomiting   Naproxen Rash    Current Outpatient Medications  Medication Sig Dispense Refill   acetaminophen (TYLENOL) 325 MG tablet Take 650 mg by mouth every 6 (six) hours as needed.     amLODipine (NORVASC) 10 MG tablet Take 10 mg by mouth daily.     aspirin 81 MG chewable tablet Chew 81 mg by mouth daily.     Calcium Carb-Cholecalciferol (CALCIUM 500 + D PO) Take 1 tablet by mouth daily.     Carboxymethylcellul-Glycerin (REFRESH RELIEVA OP) Place 1 drop into both eyes 2 (two) times daily as needed (dry eyes).     cetirizine (ZYRTEC) 10 MG tablet Take 10 mg by mouth daily as needed for allergies.     Cholecalciferol 25 MCG (1000 UT) TBDP Take 1,000 Units by mouth daily.     exemestane (AROMASIN) 25 MG tablet TAKE 1 TABLET BY MOUTH DAILY AFTER BREAKFAST 90 tablet 3   Fluocinolone Acetonide Scalp (DERMA-SMOOTHE/FS SCALP) 0.01 % OIL Apply to aa of scalp once weekly cover with shower cap wash out in morning 118.28 mL 4   fluocinonide  (LIDEX) 0.05 % external solution Apply 1 Application topically at bedtime as needed (scalp dryness). (Patient not taking: Reported on 12/25/2022) 60 mL 4   guaiFENesin (MUCINEX) 600 MG 12 hr tablet Take 600 mg by mouth 2 (two) times daily as needed for to loosen phlegm. (Patient not taking: Reported on 12/25/2022)     hydrochlorothiazide (HYDRODIURIL) 12.5 MG tablet Take 12.5 mg by mouth daily.     lidocaine-prilocaine (EMLA) cream Apply topically.     losartan (COZAAR) 50 MG tablet Take 50 mg by mouth daily. 1.5 tab in morning     meclizine (ANTIVERT) 25 MG tablet Take 25 mg by mouth 3 (three) times daily as needed for dizziness.     Multiple Vitamins-Minerals (CENTRUM SILVER PO) Take 1 tablet by mouth daily.     Multiple Vitamins-Minerals (PRESERVISION AREDS 2) CAPS Take 1 tablet by mouth 2 (two) times daily.     pantoprazole (PROTONIX) 40 MG tablet Take 40 mg by mouth daily.  simvastatin (ZOCOR) 20 MG tablet Take 20 mg by mouth daily.     sodium chloride (OCEAN) 0.65 % nasal spray Place 1 spray into the nose 2 (two) times daily as needed for congestion.     No current facility-administered medications for this visit.    OBJECTIVE: Vitals:   06/25/23 1256  BP: (!) 143/50  Pulse: 65  Resp: 16  Temp: 98.5 F (36.9 C)  SpO2: 97%     Body mass index is 24.74 kg/m.    ECOG FS:0 - Asymptomatic  General: Well-developed, well-nourished, no acute distress. Eyes: Pink conjunctiva, anicteric sclera. HEENT: Normocephalic, moist mucous membranes. Lungs: No audible wheezing or coughing. Heart: Regular rate and rhythm. Abdomen: Soft, nontender, no obvious distention. Musculoskeletal: No edema, cyanosis, or clubbing. Neuro: Alert, answering all questions appropriately. Cranial nerves grossly intact. Skin: No rashes or petechiae noted. Psych: Normal affect.  LAB RESULTS:  Lab Results  Component Value Date   NA 139 06/19/2022   K 3.4 (L) 06/19/2022   CL 101 06/19/2022   CO2 29  06/19/2022   GLUCOSE 116 (H) 06/19/2022   BUN 24 (H) 06/19/2022   CREATININE 0.94 06/19/2022   CALCIUM 9.2 06/19/2022   PROT 7.4 06/19/2022   ALBUMIN 4.6 06/19/2022   AST 21 06/19/2022   ALT 22 06/19/2022   ALKPHOS 59 06/19/2022   BILITOT 0.9 06/19/2022   GFRNONAA >60 06/19/2022    Lab Results  Component Value Date   WBC 7.9 06/19/2022   NEUTROABS 5.1 06/19/2022   HGB 14.0 06/19/2022   HCT 41.8 06/19/2022   MCV 93.3 06/19/2022   PLT 267 06/19/2022     STUDIES: No results found.  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  Given the small size of her malignancy she did not require Oncotype testing and chemotherapy was not necessary.  She did not require adjuvant XRT.  Letrozole was discontinued secondary to side effects and patient was placed back on Aromasin.  Continue total 5 years of treatment completing in March 2027.  Her most recent unilateral left screening mammogram on April 01, 2023 was reported BI-RADS 1.  Repeat in January 2026.  Return to clinic in 6 months for routine evaluation.   Osteopenia: Resolved.  Patient's most recent bone mineral density on April 01, 2023 reported T-score of -1.0 which is considered normal.  This is improved from 1 year prior where her T-score is ported -1.6.  Continue calcium and vitamin D supplementation.  Repeat in January 2027.   Joint pain: Patient does not complain of this today.   Hypertension: Improved.  Patient reports she has an appointment with primary care tomorrow.  Patient expressed understanding and was in agreement with this plan. She also understands that She can call clinic at any time with any questions, concerns, or complaints.    Cancer Staging   Primary cancer of lower-outer quadrant of right breast [facility]) Staging form: Breast, AJCC 8th Edition - Pathologic stage from 05/20/2020: Stage IA (pT1a, pN0, cM0, G1, ER+, PR+, HER2-) - Signed by Jeralyn Ruths, MD on 05/20/2020 Stage prefix: Initial diagnosis Histologic grading system: 3 grade system   Jeralyn Ruths, MD   06/25/2023 1:27 PM

## 2023-06-26 DIAGNOSIS — C50511 Malignant neoplasm of lower-outer quadrant of right female breast: Secondary | ICD-10-CM | POA: Diagnosis not present

## 2023-06-26 DIAGNOSIS — E782 Mixed hyperlipidemia: Secondary | ICD-10-CM | POA: Diagnosis not present

## 2023-06-26 DIAGNOSIS — K589 Irritable bowel syndrome without diarrhea: Secondary | ICD-10-CM | POA: Diagnosis not present

## 2023-06-26 DIAGNOSIS — Z0000 Encounter for general adult medical examination without abnormal findings: Secondary | ICD-10-CM | POA: Diagnosis not present

## 2023-06-26 DIAGNOSIS — I10 Essential (primary) hypertension: Secondary | ICD-10-CM | POA: Diagnosis not present

## 2023-06-27 DIAGNOSIS — H26491 Other secondary cataract, right eye: Secondary | ICD-10-CM | POA: Diagnosis not present

## 2023-06-27 DIAGNOSIS — Z961 Presence of intraocular lens: Secondary | ICD-10-CM | POA: Diagnosis not present

## 2023-06-27 DIAGNOSIS — H353132 Nonexudative age-related macular degeneration, bilateral, intermediate dry stage: Secondary | ICD-10-CM | POA: Diagnosis not present

## 2023-06-27 DIAGNOSIS — H43813 Vitreous degeneration, bilateral: Secondary | ICD-10-CM | POA: Diagnosis not present

## 2023-07-23 DIAGNOSIS — I10 Essential (primary) hypertension: Secondary | ICD-10-CM | POA: Diagnosis not present

## 2023-07-30 ENCOUNTER — Other Ambulatory Visit: Payer: Self-pay | Admitting: Oncology

## 2023-08-12 ENCOUNTER — Telehealth: Payer: Self-pay | Admitting: *Deleted

## 2023-08-12 NOTE — Telephone Encounter (Signed)
 The patient states that she is losing weight and she thinks that the medicine that she is on for her cancer treatment is having side effects of joint pains especially in the ankle and having hot flashes.  The patient would like to have a with Dr. Adrian Alba about this. Her pill is exemesatane

## 2023-08-13 ENCOUNTER — Encounter: Payer: Self-pay | Admitting: Oncology

## 2023-08-13 ENCOUNTER — Inpatient Hospital Stay: Attending: Oncology | Admitting: Oncology

## 2023-08-13 VITALS — BP 122/58 | HR 67 | Temp 98.6°F | Resp 16 | Ht 66.0 in | Wt 148.3 lb

## 2023-08-13 DIAGNOSIS — Z1732 Human epidermal growth factor receptor 2 negative status: Secondary | ICD-10-CM | POA: Insufficient documentation

## 2023-08-13 DIAGNOSIS — Z79899 Other long term (current) drug therapy: Secondary | ICD-10-CM | POA: Insufficient documentation

## 2023-08-13 DIAGNOSIS — C50511 Malignant neoplasm of lower-outer quadrant of right female breast: Secondary | ICD-10-CM | POA: Diagnosis not present

## 2023-08-13 DIAGNOSIS — Z8739 Personal history of other diseases of the musculoskeletal system and connective tissue: Secondary | ICD-10-CM | POA: Insufficient documentation

## 2023-08-13 DIAGNOSIS — Z1721 Progesterone receptor positive status: Secondary | ICD-10-CM | POA: Insufficient documentation

## 2023-08-13 DIAGNOSIS — M2550 Pain in unspecified joint: Secondary | ICD-10-CM | POA: Diagnosis not present

## 2023-08-13 DIAGNOSIS — I10 Essential (primary) hypertension: Secondary | ICD-10-CM | POA: Insufficient documentation

## 2023-08-13 DIAGNOSIS — Z170 Estrogen receptor positive status [ER+]: Secondary | ICD-10-CM | POA: Diagnosis not present

## 2023-08-13 NOTE — Progress Notes (Unsigned)
 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  INTERVAL HISTORY: Patient returns to clinic today for routine 32-month evaluation.  She currently feels well and is asymptomatic.  She continues to remain active.  Her only complaint today is of continued hair thinning.  She otherwise is tolerating Aromasin  without significant side effects.  She has no neurologic complaints.  She has had a recent sinus infection, but this has since resolved.  She has a good appetite and denies weight loss.  She has no chest pain, shortness of breath, cough, or hemoptysis.  She denies any nausea, vomiting, constipation, or diarrhea.  She has no urinary complaints.  Patient off no specific complaints today.  REVIEW OF SYSTEMS:   Review of Systems  Constitutional: Negative.  Negative for fever, malaise/fatigue and weight loss.  Respiratory: Negative.  Negative for cough, hemoptysis and shortness of breath.   Cardiovascular: Negative.  Negative for chest pain and leg swelling.  Gastrointestinal: Negative.  Negative for abdominal pain.  Genitourinary: Negative.  Negative for dysuria.  Musculoskeletal: Negative.  Negative for back pain and joint pain.  Skin: Negative.  Negative for rash.  Neurological: Negative.  Negative for dizziness, sensory change, focal weakness, weakness and headaches.  Psychiatric/Behavioral: Negative.  The patient is not nervous/anxious.     As per HPI. Otherwise, a complete review of systems is negative.  PAST MEDICAL HISTORY: Past Medical History:   Diagnosis Date  . Arthritis   . Basal cell carcinoma 12/08/2019   L nasal dorsum, [facility] 01/04[facility]20 [facility]  . Benign essential hypertension   . Bradycardia   . 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 Family history of adverse reaction to anesthesia    brother - PONV  . Fibrocystic breast   . GERD (gastroesophageal reflux disease)   . History of hiatal hernia 04/14/2018  . HTN (hypertension)   . Hyperlipidemia   . IBS (irritable bowel syndrome)   . Personal history of radiation therapy   . PONV (postoperative nausea and vomiting)   . Precordial pain   . Recurrent breast cancer, right (HCC) 04/2020   ER/PR (+), HER2/neu equivocal invasive mammary carcinoma  . Vertigo    (x1) over 10 yrs ago    PAST SURGICAL HISTORY: Past Surgical History:  Procedure Laterality Date  . ABDOMINAL HYSTERECTOMY     age 86  . BREAST BIOPSY Right 2003   +  . BREAST BIOPSY Right 04/13/2020   calcs, Affirm Bx-IMC/DCIS  . BREAST LUMPECTOMY Right    f/u radiation   . CATARACT EXTRACTION W/PHACO Right 08/06/2018   Procedure: CATARACT EXTRACTION PHACO AND INTRAOCULAR LENS PLACEMENT (IOC)  RIGHT;  Surgeon: Annell Kidney, MD;  Location: [facility];  Service: Ophthalmology;  Laterality: Right;  . CATARACT EXTRACTION W/PHACO Left 08/27/2018   Procedure: CATARACT EXTRACTION PHACO AND INTRAOCULAR LENS PLACEMENT (IOC)  LEFT;  Surgeon: Annell Kidney, MD;  Location: [facility];  Service: Ophthalmology;  Laterality: Left;  . COLONOSCOPY WITH PROPOFOL  N/A 04/14/2018   Procedure: COLONOSCOPY WITH PROPOFOL ;  Surgeon: Deveron Fly, MD;  Location: [facility] ENDOSCOPY;  Service: Endoscopy;  Laterality: N/A;  . ESOPHAGOGASTRODUODENOSCOPY (EGD) WITH PROPOFOL  N/A 04/14/2018   Procedure: ESOPHAGOGASTRODUODENOSCOPY (EGD) WITH PROPOFOL ;  Surgeon: Deveron Fly, MD;  Location: [facility] ENDOSCOPY;  Service: Endoscopy;  Laterality: N/A;  . EYE SURGERY    . MASTECTOMY Right  05/04/2020  . PARTIAL MASTECTOMY WITH AXILLARY SENTINEL LYMPH NODE BIOPSY Right 05/04/2020   Procedure: TOTAL  MASTECTOMY WITH AXILLARY SENTINEL LYMPH NODE BIOPSY;  Surgeon: Eldred Grego, MD;  Location: [facility] ORS;  Service: General;  Laterality: Right;    FAMILY HISTORY: Family History  Problem Relation Age of Onset  . Breast cancer Neg Hx     ADVANCED DIRECTIVES (Y/N):  N  HEALTH MAINTENANCE: Social History   Tobacco Use  . Smoking status: Never  . Smokeless tobacco: Never  Vaping Use  . Vaping status: Never Used  Substance Use Topics  . Alcohol  use: Not Currently  . Drug use: Never     Colonoscopy:  PAP:  Bone density:  Lipid panel:  Allergies  Allergen Reactions  . Azithromycin Nausea Only  . Cefuroxime Axetil Nausea And Vomiting  . Doxycycline Nausea Only    Thrush   . Levaquin [Levofloxacin] Nausea Only  . Lisinopril     Hypotension   . Other Hives    Pneumovac   . Penicillin V Potassium Swelling    Childhood reaction - arm swelling  . Sulfa Antibiotics Nausea And Vomiting  . Naproxen Rash    Current Outpatient Medications  Medication Sig Dispense Refill  . acetaminophen  (TYLENOL ) 325 MG tablet Take 650 mg by mouth every 6 (six) hours as needed.    . amLODipine  (NORVASC ) 10 MG tablet Take 10 mg by mouth daily.    . aspirin  81 MG chewable tablet Chew 81 mg by mouth daily.    . Calcium  Carb-Cholecalciferol  (CALCIUM  500 + D PO) Take 1 tablet by mouth daily.    . Carboxymethylcellul-Glycerin (REFRESH RELIEVA OP) Place 1 drop into both eyes 2 (two) times daily as needed (dry eyes).    . cetirizine (ZYRTEC) 10 MG tablet Take 10 mg by mouth daily as needed for allergies.    . Cholecalciferol  25 MCG (1000 UT) TBDP Take 1,000 Units by mouth daily.    . exemestane  (AROMASIN ) 25 MG tablet TAKE 1 TABLET BY MOUTH DAILY AFTER BREAKFAST 90 tablet 3  . Fluocinolone  Acetonide Scalp (DERMA-SMOOTHE /FS SCALP) 0.01 % OIL Apply to aa of scalp once weekly cover with  shower cap wash out in morning 118.28 mL 4  . hydrochlorothiazide  (HYDRODIURIL ) 12.5 MG tablet Take 12.5 mg by mouth daily.    . lidocaine -prilocaine (EMLA) cream Apply topically.    . losartan  (COZAAR ) 50 MG tablet Take 50 mg by mouth daily. 1.5 tab in morning    . meclizine  (ANTIVERT ) 25 MG tablet Take 25 mg by mouth 3 (three) times daily as needed for dizziness.    . Multiple Vitamins-Minerals (CENTRUM SILVER PO) Take 1 tablet by mouth daily.    . Multiple Vitamins-Minerals (PRESERVISION AREDS 2) CAPS Take 1 tablet by mouth 2 (two) times daily.    . pantoprazole  (PROTONIX ) 40 MG tablet Take 40 mg by mouth daily. (Patient not taking: Reported on 08/13/2023)    . simvastatin (ZOCOR) 20 MG tablet Take 20 mg by mouth daily.    . sodium chloride  (OCEAN) 0.65 % nasal spray Place 1 spray into the nose 2 (two) times daily as needed for congestion.    . fluocinonide  (LIDEX ) 0.05 % external solution Apply 1 Application topically at bedtime  as needed (scalp dryness). (Patient not taking: Reported on 08/13/2023) 60 mL 4  . guaiFENesin  (MUCINEX ) 600 MG 12 hr tablet Take 600 mg by mouth 2 (two) times daily as needed for to loosen phlegm. (Patient not taking: Reported on 08/13/2023)     No current facility-administered medications for this visit.    OBJECTIVE: Vitals:   08/13/23 1503  BP: (!) 122/58  Pulse: 67  Resp: 16  Temp: 98.6 F (37 C)  SpO2: 97%     Body mass index is 23.94 kg/m.    ECOG FS:0 - Asymptomatic  General: Well-developed, well-nourished, no acute distress. Eyes: Pink conjunctiva, anicteric sclera. HEENT: Normocephalic, moist mucous membranes. Lungs: No audible wheezing or coughing. Heart: Regular rate and rhythm. Abdomen: Soft, nontender, no obvious distention. Musculoskeletal: No edema, cyanosis, or clubbing. Neuro: Alert, answering all questions appropriately. Cranial nerves grossly intact. Skin: No rashes or petechiae noted. Psych: Normal affect.  LAB RESULTS:  Lab Results   Component Value Date   NA 139 06/19/2022   K 3.4 (L) 06/19/2022   CL 101 06/19/2022   CO2 29 06/19/2022   GLUCOSE 116 (H) 06/19/2022   BUN 24 (H) 06/19/2022   CREATININE 0.94 06/19/2022   CALCIUM  9.2 06/19/2022   PROT 7.4 06/19/2022   ALBUMIN 4.6 06/19/2022   AST 21 06/19/2022   ALT 22 06/19/2022   ALKPHOS 59 06/19/2022   BILITOT 0.9 06/19/2022   GFRNONAA >60 06/19/2022    Lab Results  Component Value Date   WBC 7.9 06/19/2022   NEUTROABS 5.1 06/19/2022   HGB 14.0 06/19/2022   HCT 41.8 06/19/2022   MCV 93.3 06/19/2022   PLT 267 06/19/2022     STUDIES: No results found.  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  Given the small size of her malignancy she did not require Oncotype testing and chemotherapy was not necessary.  She did not require adjuvant XRT.  Letrozole  was discontinued secondary to side effects and patient was placed back on Aromasin .  Continue total 5 years of treatment completing in March 2027.  Her most recent unilateral left screening mammogram on April 01, 2023 was reported BI-RADS 1.  Repeat in January 2026.  Return to clinic in 6 months for routine evaluation.   Osteopenia: Resolved.  Patient's most recent bone mineral density on April 01, 2023 reported T-score of -1.0 which is considered normal.  This is improved from 1 year prior where her T-score is ported -1.6.  Continue calcium  and vitamin D  supplementation.  Repeat in January 2027.   Joint pain: Patient does not complain of this today.   Hypertension: Improved.  Patient reports she has an appointment with primary care tomorrow.  Patient expressed understanding and was in agreement with this plan. She also understands that She  can call clinic at any time with any questions, concerns, or complaints.    Cancer Staging  Primary cancer of lower-outer quadrant of right breast [facility]) Staging form: Breast, AJCC 8th Edition - Pathologic stage from 05/20/2020: Stage IA (pT1a, pN0, cM0, G1, ER+, PR+, HER2-) - Signed by Shellie Dials, MD on 05/20/2020 Stage prefix: Initial diagnosis Histologic grading system: 3 grade system   Shellie Dials, MD   08/13/2023 3:27 PM

## 2023-08-13 NOTE — Progress Notes (Unsigned)
 Having pain in ankles and shoulders. Also losing weight and feeling really tired. Having a jittery feeling. Wonders if this all has to do with the aromasin  and has not taken it the last 3 days.

## 2023-09-04 ENCOUNTER — Telehealth: Payer: Self-pay | Admitting: Oncology

## 2023-09-04 DIAGNOSIS — M25551 Pain in right hip: Secondary | ICD-10-CM | POA: Diagnosis not present

## 2023-09-04 DIAGNOSIS — I10 Essential (primary) hypertension: Secondary | ICD-10-CM | POA: Diagnosis not present

## 2023-09-04 NOTE — Telephone Encounter (Signed)
 Pt called and stated she wants to see Dr.Finnegan. she stated MD took her off of the chemo pill to see if the pain went away. She stated she is losing weight and still having pain, like a burning sensation and it feels likes it in her muscles.   Please advise on scheduling. Best number is the home phone number listed on chart.

## 2023-09-10 DIAGNOSIS — M5416 Radiculopathy, lumbar region: Secondary | ICD-10-CM | POA: Diagnosis not present

## 2023-09-10 DIAGNOSIS — M25551 Pain in right hip: Secondary | ICD-10-CM | POA: Diagnosis not present

## 2023-10-22 DIAGNOSIS — K117 Disturbances of salivary secretion: Secondary | ICD-10-CM | POA: Diagnosis not present

## 2023-10-22 DIAGNOSIS — R110 Nausea: Secondary | ICD-10-CM | POA: Diagnosis not present

## 2023-10-22 DIAGNOSIS — I10 Essential (primary) hypertension: Secondary | ICD-10-CM | POA: Diagnosis not present

## 2023-10-22 DIAGNOSIS — R531 Weakness: Secondary | ICD-10-CM | POA: Diagnosis not present

## 2023-10-22 DIAGNOSIS — R5383 Other fatigue: Secondary | ICD-10-CM | POA: Diagnosis not present

## 2023-10-24 DIAGNOSIS — R509 Fever, unspecified: Secondary | ICD-10-CM | POA: Diagnosis not present

## 2023-10-24 DIAGNOSIS — J019 Acute sinusitis, unspecified: Secondary | ICD-10-CM | POA: Diagnosis not present

## 2023-10-24 DIAGNOSIS — U071 COVID-19: Secondary | ICD-10-CM | POA: Diagnosis not present

## 2023-10-24 DIAGNOSIS — Z03818 Encounter for observation for suspected exposure to other biological agents ruled out: Secondary | ICD-10-CM | POA: Diagnosis not present

## 2023-10-30 DIAGNOSIS — C50919 Malignant neoplasm of unspecified site of unspecified female breast: Secondary | ICD-10-CM | POA: Diagnosis not present

## 2023-10-30 DIAGNOSIS — R1084 Generalized abdominal pain: Secondary | ICD-10-CM | POA: Diagnosis not present

## 2023-10-30 DIAGNOSIS — R634 Abnormal weight loss: Secondary | ICD-10-CM | POA: Diagnosis not present

## 2023-10-30 DIAGNOSIS — U071 COVID-19: Secondary | ICD-10-CM | POA: Diagnosis not present

## 2023-11-01 ENCOUNTER — Other Ambulatory Visit: Payer: Self-pay | Admitting: Internal Medicine

## 2023-11-01 DIAGNOSIS — R1084 Generalized abdominal pain: Secondary | ICD-10-CM

## 2023-11-01 DIAGNOSIS — U071 COVID-19: Secondary | ICD-10-CM

## 2023-11-05 ENCOUNTER — Ambulatory Visit
Admission: RE | Admit: 2023-11-05 | Discharge: 2023-11-05 | Disposition: A | Discharge status: Home / Self Care | Source: Ambulatory Visit | Attending: Internal Medicine | Admitting: Internal Medicine

## 2023-11-05 DIAGNOSIS — R1084 Generalized abdominal pain: Secondary | ICD-10-CM | POA: Diagnosis not present

## 2023-11-05 DIAGNOSIS — K5730 Diverticulosis of large intestine without perforation or abscess without bleeding: Secondary | ICD-10-CM | POA: Diagnosis not present

## 2023-11-05 DIAGNOSIS — U071 COVID-19: Secondary | ICD-10-CM | POA: Diagnosis not present

## 2023-11-05 DIAGNOSIS — N281 Cyst of kidney, acquired: Secondary | ICD-10-CM | POA: Diagnosis not present

## 2023-11-05 MED ORDER — IOHEXOL 300 MG/ML  SOLN
80.0000 mL | Freq: Once | INTRAMUSCULAR | Status: AC | PRN
  Administered 2023-11-05: 80 mL via INTRAVENOUS

## 2023-11-06 ENCOUNTER — Ambulatory Visit

## 2023-11-06 DIAGNOSIS — N750 Cyst of Bartholin's gland: Secondary | ICD-10-CM

## 2023-11-06 NOTE — Patient Instructions (Addendum)
 Bartholin gland cyst      Due to recent changes in healthcare laws, you may see results of your pathology and/or laboratory studies on MyChart before the doctors have had a chance to review them. We understand that in some cases there may be results that are confusing or concerning to you. Please understand that not all results are received at the same time and often the doctors may need to interpret multiple results in order to provide you with the best plan of care or course of treatment. Therefore, we ask that you please give us  2 business days to thoroughly review all your results before contacting the office for clarification. Should we see a critical lab result, you will be contacted sooner.   If You Need Anything After Your Visit  If you have any questions or concerns for your doctor, please call our main line at [phone] and press option 4 to reach your doctor's medical assistant. If no one answers, please leave a voicemail as directed and we will return your call as soon as possible. Messages left after 4 pm will be answered the following business day.   You may also send us  a message via MyChart. We typically respond to MyChart messages within 1-2 business days.  For prescription refills, please ask your pharmacy to contact our office. Our fax number is [phone].  If you have an urgent issue when the clinic is closed that cannot wait until the next business day, you can page your doctor at the number below.    Please note that while we do our best to be available for urgent issues outside of office hours, we are not available 24/7.   If you have an urgent issue and are unable to reach us , you may choose to seek medical care at your doctor's office, retail clinic, urgent care center, or emergency room.  If you have a medical emergency, please immediately call 911 or go to the emergency department.  Pager Numbers  - Dr. Hester: [phone]  - Dr. Jackquline:  [phone]  - Dr. Claudene: [phone]   - Dr. Raymund: [phone]  In the event of inclement weather, please call our main line at [phone] for an update on the status of any delays or closures.  Dermatology Medication Tips: Please keep the boxes that topical medications come in in order to help keep track of the instructions about where and how to use these. Pharmacies typically print the medication instructions only on the boxes and not directly on the medication tubes.   If your medication is too expensive, please contact our office at [phone] option 4 or send us  a message through MyChart.   We are unable to tell what your co-pay for medications will be in advance as this is different depending on your insurance coverage. However, we may be able to find a substitute medication at lower cost or fill out paperwork to get insurance to cover a needed medication.   If a prior authorization is required to get your medication covered by your insurance company, please allow us  1-2 business days to complete this process.  Drug prices often vary depending on where the prescription is filled and some pharmacies may offer cheaper prices.  The website www.goodrx.com contains coupons for medications through different pharmacies. The prices here do not account for what the cost may be with help from insurance (it may be cheaper with your insurance), but the website can give you the price if you did not use  any insurance.  - You can print the associated coupon and take it with your prescription to the pharmacy.  - You may also stop by our office during regular business hours and pick up a GoodRx coupon card.  - If you need your prescription sent electronically to a different pharmacy, notify our office through [facility] or by phone at [phone] option 4.     Si Usted Necesita Algo Despus de Su Visita  Tambin puede enviarnos un mensaje a travs de Clinical cytogeneticist. Por lo general  respondemos a los mensajes de MyChart en el transcurso de 1 a 2 das hbiles.  Para renovar recetas, por favor pida a su farmacia que se ponga en contacto con nuestra oficina. Randi lakes de fax es [place] [phone].  Si tiene un asunto urgente cuando la clnica est cerrada y que no puede esperar hasta el siguiente da hbil, puede llamar/localizar a su doctor(a) al nmero que aparece a continuacin.   Por favor, tenga en cuenta que aunque hacemos todo lo posible para estar disponibles para asuntos urgentes fuera del horario de [place], no estamos disponibles las 24 horas del da, los 7 [address]  [address] de la [place].   Si tiene un problema urgente y no puede comunicarse con nosotros, puede optar por buscar atencin mdica  en el consultorio de su doctor(a), en una clnica privada, en un centro de atencin urgente o en una sala de emergencias.  Si tiene Engineer, drilling, por favor llame inmediatamente al 911 o vaya a la sala de emergencias.  Nmeros de bper  - Dr. Hester: [phone]  - Dra. Jackquline: [phone]  - Dr. Claudene: [phone]  - Dra. Kitts: [phone]  En caso de inclemencias del [place], por favor llame a nuestra lnea principal al [phone] para una actualizacin sobre el estado de cualquier retraso o cierre.  Consejos para la medicacin en dermatologa: Por favor, guarde las cajas en las que vienen los medicamentos de uso tpico para ayudarle a seguir las instrucciones sobre dnde y cmo usarlos. Las farmacias generalmente imprimen las instrucciones del medicamento slo en las cajas y no directamente en los tubos del [place].   Si su medicamento es muy caro, por favor, pngase en contacto con landry rieger llamando al [phone] y presione la opcin 4 o envenos un mensaje a travs de Clinical cytogeneticist.   No podemos decirle cul ser su copago por los medicamentos por adelantado ya que esto es diferente dependiendo de la cobertura de su seguro. Sin embargo, es posible  que podamos encontrar un medicamento sustituto a Audiological scientist un formulario para que el seguro cubra el medicamento que se considera necesario.   Si se requiere una autorizacin previa para que su compaa de seguros malta su medicamento, por favor permtanos de 1 a 2 das hbiles para completar este proceso.  Los precios de los medicamentos varan con frecuencia dependiendo del Environmental consultant de dnde se surte la receta y alguna farmacias pueden ofrecer precios ms baratos.  El sitio web www.goodrx.com tiene cupones para medicamentos de Health and safety inspector. Los precios aqu no tienen en cuenta lo que podra costar con la ayuda del seguro (puede ser ms barato con su seguro), pero el sitio web puede darle el precio si no utiliz Tourist information centre manager.  - Puede imprimir el cupn correspondiente y llevarlo con su receta a la farmacia.  - Tambin puede pasar por nuestra oficina durante el horario de atencin regular y Education officer, museum una tarjeta de cupones de GoodRx.  - Si necesita que su  receta se enve electrnicamente a una farmacia diferente, informe a nuestra oficina a travs de MyChart de [facility] o por telfono llamando al [phone] y presione la opcin 4.

## 2023-11-06 NOTE — Progress Notes (Signed)
   Follow-Up Visit   Subjective  Cheryl Morris is a 83 y.o. female who presents for the following: Patient c/o growth/knot in the groin area she recently noticed 5 days, no symptoms, slightly tender. May have drained slightly. Patient report she had a cyst in this area in the past. Used warm compress. Feels it is getting smaller.   Hx of breast cancer Hx of BCC  The following portions of the chart were reviewed this encounter and updated as appropriate: medications, allergies, medical history  Review of Systems:  No other skin or systemic complaints except as noted in HPI or Assessment and Plan.  Objective  Well appearing patient in no apparent distress; mood and affect are within normal limits.  A focused examination was performed of the following areas: female genital exam of mons pubis, labia majora and minora . Chaperone present.  - L upper vaginal opening with firm round mobile cyst    Relevant exam findings are noted in the Assessment and Plan.    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 Encouraged continued use of warm compress to aid with drainage.  - Advised can trial OTC sitz bath  - Advised if not improving or worsening can initiate abx to help with inflammation  - Given has had cysts like this in past recommended discussing with OB/GYN to see if more definitive treatments (drainage vs marsupialization vs excision)     Return if symptoms worsen or fail to improve. IFay Kirks, CMA, am acting as scribe for Lauraine JAYSON Kanaris, MD .    Documentation: I have reviewed the above documentation for accuracy and completeness, and I agree with the above.  Lauraine JAYSON Kanaris, MD

## 2023-12-23 ENCOUNTER — Telehealth: Payer: Self-pay | Admitting: Oncology

## 2023-12-23 ENCOUNTER — Inpatient Hospital Stay: Admitting: Oncology

## 2023-12-23 NOTE — Telephone Encounter (Signed)
 Pt r/s MD appt from today 10/13 to 10/20 due to going to a funeral today.

## 2023-12-24 DIAGNOSIS — I10 Essential (primary) hypertension: Secondary | ICD-10-CM | POA: Diagnosis not present

## 2023-12-30 ENCOUNTER — Encounter: Payer: Self-pay | Admitting: Oncology

## 2023-12-30 ENCOUNTER — Inpatient Hospital Stay: Attending: Oncology | Admitting: Oncology

## 2023-12-30 VITALS — BP 107/54 | HR 69 | Temp 98.0°F | Resp 16 | Ht 66.0 in | Wt 145.0 lb

## 2023-12-30 DIAGNOSIS — Z79899 Other long term (current) drug therapy: Secondary | ICD-10-CM | POA: Diagnosis not present

## 2023-12-30 DIAGNOSIS — Z1721 Progesterone receptor positive status: Secondary | ICD-10-CM | POA: Diagnosis not present

## 2023-12-30 DIAGNOSIS — Z1732 Human epidermal growth factor receptor 2 negative status: Secondary | ICD-10-CM | POA: Diagnosis not present

## 2023-12-30 DIAGNOSIS — I10 Essential (primary) hypertension: Secondary | ICD-10-CM | POA: Diagnosis not present

## 2023-12-30 DIAGNOSIS — Z923 Personal history of irradiation: Secondary | ICD-10-CM | POA: Insufficient documentation

## 2023-12-30 DIAGNOSIS — C50511 Malignant neoplasm of lower-outer quadrant of right female breast: Secondary | ICD-10-CM | POA: Insufficient documentation

## 2023-12-30 DIAGNOSIS — Z170 Estrogen receptor positive status [ER+]: Secondary | ICD-10-CM | POA: Insufficient documentation

## 2023-12-30 NOTE — Progress Notes (Unsigned)
 Patient has been off of the Aromasin  since June 1st 2025. She told me that since she has been off of the medication she has felt better.

## 2023-12-30 NOTE — Progress Notes (Unsigned)
 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  INTERVAL HISTORY: Patient returns to clinic today for routine evaluation.  Since discontinuing Aromasin  her joint pain has significantly improved and she feels back to her baseline.  She currently feels well and is asymptomatic. She has no neurologic complaints.  She has a good appetite and denies weight loss.  She does not complain of any weakness or fatigue.  She denies any recent fevers or illnesses. She has no chest pain, shortness of breath, cough, or hemoptysis.  She denies any nausea, vomiting, constipation, or diarrhea.  She has no urinary complaints.  Patient offers no specific complaints today.  REVIEW OF SYSTEMS:   Review of Systems  Constitutional: Negative.  Negative for fever, malaise/fatigue and weight loss.  Respiratory: Negative.  Negative for cough, hemoptysis and shortness of breath.   Cardiovascular: Negative.  Negative for chest pain and leg swelling.  Gastrointestinal: Negative.  Negative for abdominal pain.  Genitourinary: Negative.  Negative for dysuria.  Musculoskeletal: Negative.  Negative for back pain and joint pain.  Skin: Negative.  Negative for rash.  Neurological: Negative.  Negative for dizziness, sensory change, focal weakness, weakness and headaches.  Psychiatric/Behavioral: Negative.  The patient is not nervous/anxious.     As per HPI. Otherwise, a complete review of systems is negative.  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   Family history of adverse reaction to anesthesia    brother - PONV   Fibrocystic breast    GERD (gastroesophageal reflux disease)    History of hiatal hernia 04/14/2018   HTN (hypertension)    Hyperlipidemia    IBS (irritable bowel syndrome)    Personal history of radiation therapy    PONV (postoperative nausea and vomiting)    Precordial pain    Recurrent breast cancer, right (HCC) 04/2020   ER/PR (+), HER2/neu equivocal invasive mammary carcinoma   Vertigo    (x1) over 10 yrs ago    PAST SURGICAL HISTORY: Past Surgical History:  Procedure Laterality Date   ABDOMINAL HYSTERECTOMY     age 83   BREAST BIOPSY Right 2003   +   BREAST BIOPSY Right 04/13/2020   calcs, Affirm Bx-[facility]/DCIS   BREAST LUMPECTOMY Right    f/u radiation    CATARACT EXTRACTION W/PHACO Right 08/06/2018   Procedure: CATARACT EXTRACTION PHACO AND INTRAOCULAR LENS PLACEMENT (IOC)  RIGHT;  Surgeon: Mittie Gaskin, MD;  Location: [facility];  Service: Ophthalmology;  Laterality: Right;   CATARACT EXTRACTION W/PHACO Left 08/27/2018   Procedure: CATARACT EXTRACTION PHACO AND INTRAOCULAR LENS PLACEMENT (IOC)  LEFT;  Surgeon: Mittie Gaskin, MD;  Location: [facility];  Service: Ophthalmology;  Laterality: Left;   COLONOSCOPY WITH PROPOFOL  N/A 04/14/2018   Procedure: COLONOSCOPY WITH PROPOFOL ;  Surgeon: Gaylyn Gladis PENNER, MD;  Location: [facility] END[facility]COPY;  Service: Endoscopy;  Laterality: N/A;   ESOPHAGOGASTRODUODENOSCOPY (EGD) WITH  PROPOFOL  N/A 04/14/2018   Procedure: ESOPHAGOGASTRODUODENOSCOPY (EGD) WITH PROPOFOL ;  Surgeon: Gaylyn Gladis PENNER, MD;  Location: [facility] END[facility]COPY;  Service: Endoscopy;  Laterality: N/A;   EYE SURGERY     MASTECTOMY Right 05/04/2020   PARTIAL MASTECTOMY WITH AXILLARY SENTINEL LYMPH NODE BIOPSY  Right 05/04/2020   Procedure: TOTAL  MASTECTOMY WITH AXILLARY SENTINEL LYMPH NODE BIOPSY;  Surgeon: Rodolph Romano, MD;  Location: [facility] ORS;  Service: General;  Laterality: Right;    FAMILY HISTORY: Family History  Problem Relation Age of Onset   Breast cancer Neg Hx     ADVANCED DIRECTIVES (Y/N):  N  HEALTH MAINTENANCE: Social History   Tobacco Use   Smoking status: Never   Smokeless tobacco: Never  Vaping Use   Vaping status: Never Used  Substance Use Topics   Alcohol  use: Not Currently   Drug use: Never     Colonoscopy:  PAP:  Bone density:  Lipid panel:  Allergies  Allergen Reactions   Azithromycin Nausea Only   Cefuroxime Axetil Nausea And Vomiting   Doxycycline Nausea Only    Thrush    Levaquin [Levofloxacin] Nausea Only   Lisinopril     Hypotension    Other Hives    Pneumovac    Penicillin V Potassium Swelling    Childhood reaction - arm swelling   Sulfa Antibiotics Nausea And Vomiting   Naproxen Rash    Current Outpatient Medications  Medication Sig Dispense Refill   acetaminophen  (TYLENOL ) 325 MG tablet Take 650 mg by mouth every 6 (six) hours as needed.     amLODipine  (NORVASC ) 10 MG tablet Take 10 mg by mouth daily.     Calcium  Carb-Cholecalciferol  (CALCIUM  500 + D PO) Take 1 tablet by mouth daily.     Carboxymethylcellul-Glycerin (REFRESH RELIEVA OP) Place 1 drop into both eyes 2 (two) times daily as needed (dry eyes).     cetirizine (ZYRTEC) 10 MG tablet Take 10 mg by mouth daily as needed for allergies.     Cholecalciferol  25 MCG (1000 UT) TBDP Take 1,000 Units by mouth daily.     hydrochlorothiazide  (HYDRODIURIL ) 12.5 MG tablet Take 12.5 mg by mouth daily.     Multiple Vitamins-Minerals (CENTRUM SILVER PO) Take 1 tablet by mouth daily.     simvastatin (ZOCOR) 10 MG tablet Take 10 mg by mouth at bedtime.     sodium chloride  (OCEAN) 0.65 % nasal spray Place 1 spray into the nose 2 (two) times daily as needed for congestion.     exemestane   (AROMASIN ) 25 MG tablet TAKE 1 TABLET BY MOUTH DAILY AFTER BREAKFAST (Patient not taking: Reported on 12/30/2023) 90 tablet 3   Fluocinolone  Acetonide Scalp (DERMA-SMOOTHE /FS SCALP) 0.01 % OIL Apply to aa of scalp once weekly cover with shower cap wash out in morning (Patient not taking: Reported on 12/30/2023) 118.28 mL 4   fluocinonide  (LIDEX ) 0.05 % external solution Apply 1 Application topically at bedtime as needed (scalp dryness). (Patient not taking: Reported on 12/30/2023) 60 mL 4   guaiFENesin  (MUCINEX ) 600 MG 12 hr tablet Take 600 mg by mouth 2 (two) times daily as needed for to loosen phlegm. (Patient not taking: Reported on 12/30/2023)     lidocaine -prilocaine (EMLA) cream Apply topically. (Patient not taking: Reported on 12/30/2023)     losartan  (COZAAR ) 50 MG tablet Take 50 mg by mouth daily. 1.5 tab in morning (Patient not taking: Reported on 12/30/2023)     meclizine  (ANTIVERT ) 25 MG tablet Take 25 mg by mouth 3 (three)  times daily as needed for dizziness. (Patient not taking: Reported on 12/30/2023)     Multiple Vitamins-Minerals (PRESERVISION AREDS 2) CAPS Take 1 tablet by mouth 2 (two) times daily. (Patient not taking: Reported on 12/30/2023)     pantoprazole  (PROTONIX ) 40 MG tablet Take 40 mg by mouth daily. (Patient not taking: Reported on 12/30/2023)     No current facility-administered medications for this visit.    OBJECTIVE: Vitals:   12/30/23 1428  BP: (!) 107/54  Pulse: 69  Resp: 16  Temp: 98 F (36.7 C)  SpO2: 97%     Body mass index is 23.4 kg/m.    ECOG FS:0 - Asymptomatic  General: Well-developed, well-nourished, no acute distress. Eyes: Pink conjunctiva, anicteric sclera. HEENT: Normocephalic, moist mucous membranes. Lungs: No audible wheezing or coughing. Heart: Regular rate and rhythm. Abdomen: Soft, nontender, no obvious distention. Musculoskeletal: No edema, cyanosis, or clubbing. Neuro: Alert, answering all questions appropriately. Cranial nerves  grossly intact. Skin: No rashes or petechiae noted. Psych: Normal affect.  LAB RESULTS:  Lab Results  Component Value Date   NA 139 06/19/2022   K 3.4 (L) 06/19/2022   CL 101 06/19/2022   CO2 29 06/19/2022   GLUCOSE 116 (H) 06/19/2022   BUN 24 (H) 06/19/2022   CREATININE 0.94 06/19/2022   CALCIUM  9.2 06/19/2022   PROT 7.4 06/19/2022   ALBUMIN 4.6 06/19/2022   AST 21 06/19/2022   ALT 22 06/19/2022   ALKPHOS 59 06/19/2022   BILITOT 0.9 06/19/2022   GFRNONAA >60 06/19/2022    Lab Results  Component Value Date   WBC 7.9 06/19/2022   NEUTROABS 5.1 06/19/2022   HGB 14.0 06/19/2022   HCT 41.8 06/19/2022   MCV 93.3 06/19/2022   PLT 267 06/19/2022     STUDIES: No results found.  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  Given the small size of her malignancy she did not require Oncotype testing and chemotherapy was not necessary.  She did not require adjuvant XRT.  Letrozole  was discontinued secondary to side effects and patient was placed back on Aromasin .  Patient has now discontinued Aromasin .  She has taken 3 of the 5 recommended years of treatment and does not wish to try other medications.  No further intervention is needed.  Her most recent unilateral left screening mammogram on April 01, 2023 was reported BI-RADS 1.  Repeat in January 2026.  Return to clinic in 6 months for routine evaluation at which point can consider discharging patient from clinic.   Osteopenia: Resolved.  Patient's most recent bone mineral density on April 01, 2023 reported T-score of -1.0 which is considered normal.  This is improved from 1 year prior where her T-score is ported -1.6.  Continue calcium  and vitamin D   supplementation.  Repeat in January 2027.   Joint pain: Resolved with the discontinuation of Aromasin .   Hypertension: Continue monitoring treatment per primary care.  Patient expressed understanding and was in agreement with this plan. She also understands that She can call clinic at any time with any questions, concerns, or complaints.    Cancer Staging  Primary cancer of lower-outer quadrant of right breast [facility]) Staging form: Breast, AJCC 8th Edition - Pathologic stage from 05/20/2020: Stage IA (pT1a, pN0, cM0, G1, ER+,  PR+, HER2-) - Signed by Jacobo Evalene PARAS, MD on 05/20/2020 Stage prefix: Initial diagnosis Histologic grading system: 3 grade system   Evalene PARAS Jacobo, MD   12/31/2023 1:42 PM

## 2024-01-02 DIAGNOSIS — H353132 Nonexudative age-related macular degeneration, bilateral, intermediate dry stage: Secondary | ICD-10-CM | POA: Diagnosis not present

## 2024-01-02 DIAGNOSIS — H26491 Other secondary cataract, right eye: Secondary | ICD-10-CM | POA: Diagnosis not present

## 2024-01-02 DIAGNOSIS — H43813 Vitreous degeneration, bilateral: Secondary | ICD-10-CM | POA: Diagnosis not present

## 2024-01-02 DIAGNOSIS — Z961 Presence of intraocular lens: Secondary | ICD-10-CM | POA: Diagnosis not present

## 2024-01-22 ENCOUNTER — Ambulatory Visit

## 2024-01-22 DIAGNOSIS — Z872 Personal history of diseases of the skin and subcutaneous tissue: Secondary | ICD-10-CM | POA: Diagnosis not present

## 2024-01-22 DIAGNOSIS — Z1283 Encounter for screening for malignant neoplasm of skin: Secondary | ICD-10-CM | POA: Diagnosis not present

## 2024-01-22 DIAGNOSIS — L814 Other melanin hyperpigmentation: Secondary | ICD-10-CM

## 2024-01-22 DIAGNOSIS — D229 Melanocytic nevi, unspecified: Secondary | ICD-10-CM

## 2024-01-22 DIAGNOSIS — L578 Other skin changes due to chronic exposure to nonionizing radiation: Secondary | ICD-10-CM

## 2024-01-22 DIAGNOSIS — L821 Other seborrheic keratosis: Secondary | ICD-10-CM

## 2024-01-22 DIAGNOSIS — Z85828 Personal history of other malignant neoplasm of skin: Secondary | ICD-10-CM

## 2024-01-22 DIAGNOSIS — D1801 Hemangioma of skin and subcutaneous tissue: Secondary | ICD-10-CM | POA: Diagnosis not present

## 2024-01-22 DIAGNOSIS — L219 Seborrheic dermatitis, unspecified: Secondary | ICD-10-CM

## 2024-01-22 DIAGNOSIS — L720 Epidermal cyst: Secondary | ICD-10-CM | POA: Diagnosis not present

## 2024-01-22 DIAGNOSIS — W908XXA Exposure to other nonionizing radiation, initial encounter: Secondary | ICD-10-CM | POA: Diagnosis not present

## 2024-01-22 DIAGNOSIS — C4490 Unspecified malignant neoplasm of skin, unspecified: Secondary | ICD-10-CM

## 2024-01-22 MED ORDER — KETOCONAZOLE 2 % EX SHAM: MEDICATED_SHAMPOO | CUTANEOUS | 5 refills | Status: AC

## 2024-01-22 MED ORDER — CLOBETASOL PROPIONATE 0.05 % EX SOLN: CUTANEOUS | 5 refills | Status: AC

## 2024-01-22 NOTE — Patient Instructions (Signed)
 Sunscreen  Who needs sunscreen? Everyone. Sunscreen use can help prevent skin cancer by protecting you from the sun's harmful ultraviolet rays. Anyone can get skin cancer, regardless of age, gender or race. In fact, it is estimated that one in five Americans will develop skin cancer in their lifetime.  Sunscreen alone cannot fully protect you. In addition to wearing sunscreen, dermatologists recommend taking the following steps to protect your skin and find skin cancer early:  Seek shade when appropriate, remembering that the sun's rays are strongest between 10 a.m. and 2 p.m. If your shadow is shorter than you are, seek shade. Dress to protect yourself from the sun by wearing a lightweight long-sleeved shirt, pants, a wide-brimmed hat and sunglasses, when possible.  Use extra caution near water, snow and sand as they reflect the damaging rays of the sun, which can increase your chance of sunburn.  Get vitamin D safely through a healthy diet that may include vitamin supplements. Don't seek the sun. Avoid tanning beds. Ultraviolet light from the sun and tanning beds can cause skin cancer and wrinkling. If you want to look tan, you may wish to use a self-tanning product, but continue to use sunscreen with it.  When should I use sunscreen? Every day you go outside--even if you're just walking to and from your form of transportation. The sun emits harmful UV rays year-round. Even on cloudy days, up to 80 percent of the sun's harmful UV rays can penetrate your skin. Snow, sand and water increase the need for sunscreen because they reflect the sun's rays.  How much sunscreen should I use, and how often should I apply it? Most people only apply 25-50 percent of the recommended amount of sunscreen. Apply enough sunscreen to cover all exposed skin. Most adults need about 1 ounce -- or enough to fill a shot glass -- to fully cover their body.  Don't forget to apply to the tops of your feet, your neck, your ears  and the top of your head. Apply sunscreen to dry skin 15 minutes before going outdoors.  Skin cancer also can form on the lips. To protect your lips, apply a lip balm or lipstick that contains sunscreen with an SPF of 30 or higher.  When outdoors, reapply sunscreen approximately every two hours, or after swimming or sweating, according to the directions on the bottle.   Broad-spectrum sunscreens protect against both UVA and UVB rays. What is the difference between the rays? Sunlight consists of two types of harmful rays that reach the earth -- UVA rays and UVB rays. Overexposure to either can lead to skin cancer. In addition to causing skin cancer, here's what each of these rays do:  UVA rays (or aging rays) can prematurely age your skin, causing wrinkles and age spots, and can pass through window glass. UVB rays (or burning rays) are the primary cause of sunburn and are blocked by window glass  There is no safe way to tan. Every time you tan, you damage your skin. As this damage builds, you speed up the aging of your skin and increase your risk for all types of skin cancer.  What is the difference between chemical and physical sunscreens? Chemical sunscreens work like a sponge, absorbing the sun's rays. They contain one or more of the following active ingredients: oxybenzone, avobenzone, octisalate, octocrylene, homosalate and octinoxate. These formulations tend to be easier to rub into the skin without leaving a white residue.   Physical sunscreens work like a shield,  sitting sit on the surface of your skin and deflecting the sun's rays. They contain the active ingredients zinc oxide and/or titanium dioxide. Use this sunscreen if you have sensitive skin.   What type of sunscreen should I use? The best type of sunscreen is the one you will use again and again. Just make sure it offers broad-spectrum (UVA and UVB) protection, has an SPF of 30+, and is water-resistant. The kind of sunscreen you use is  a matter of personal choice, and may vary depending on the area of the body to be protected. Available sunscreen options include lotions, creams, gels, ointments, wax sticks and sprays.  Recommended physical sunscreens for face: - Neutrogena Sheer Zinc - Aveeno Positively Mineral Sensitive - CeraVe Hydrating Mineral (also has a tinted version) - La Roche-Posay Anthelios Mineral Face (comes as a cream, lotion, light fluid, and there is also a tinted version).  - EltaMD UV Clear (also has a tinted version)  Recommended physical sunscreens for body: - Neutrogena Sheer Zinc Dry-Touch Sunscreen Sensitive Skin Lotion Broad Spectrum SPF 50 - Aveeno Positively Mineral Sensitive Skin Sunscreen Broad Spectrum SPF 50 - La Roche-Posay Anthelios SPF 50 Mineral Sunscreen - Gentle Lotion - CeraVe Hydrating Mineral Sunscreen SPF 50  Recommended chemical sunscreens for face: - Anthelios UV Correct Face Sunscreen SPF 70 with Niacinamide - Neutrogena Clear Face Oil-Free SPF 50 with Helioplex - Neutrogena Sport Face Oil-Free SPF 70+ with Helioplex - Aveeno Protect + Hydrate Sunscreen For Face SPF 70 - La Roche-Posay Anthelios Light Fluid Sunscreen for Face SPF 60  Recommended chemical sunscreens for body: - Neutrogena Ultra Sheer Dry-Touch Sunscreen SPF 70 - Aveeno Protect + Hydrate Broad Spectrum All-Day Hydration SPF 60 (comes in a big pump) - La Roche-Posay Anthelios Melt-In Milk Sunscreen SPF 60   Melanoma ABCDEs  Melanoma is the most dangerous type of skin cancer, and is the leading cause of death from skin disease.  You are more likely to develop melanoma if you: Have light-colored skin, light-colored eyes, or red or blond hair Spend a lot of time in the sun Tan regularly, either outdoors or in a tanning bed Have had blistering sunburns, especially during childhood Have a close family member who has had a melanoma Have atypical moles or large birthmarks  Early detection of melanoma is key  since treatment is typically straightforward and cure rates are extremely high if we catch it early.   The first sign of melanoma is often a change in a mole or a new dark spot.  The ABCDE system is a way of remembering the signs of melanoma.  A for asymmetry:  The two halves do not match. B for border:  The edges of the growth are irregular. C for color:  A mixture of colors are present instead of an even brown color. D for diameter:  Melanomas are usually (but not always) greater than 6mm - the size of a pencil eraser. E for evolution:  The spot keeps changing in size, shape, and color.  Please check your skin once per month between visits. You can use a small mirror in front and a large mirror behind you to keep an eye on the back side or your body.   If you see any new or changing lesions before your next follow-up, please call to schedule a visit.  Please continue daily skin protection including broad spectrum sunscreen SPF 30+ to sun-exposed areas, reapplying every 2 hours as needed when you're outdoors.   Staying in  the shade or wearing long sleeves, sun glasses (UVA+UVB protection) and wide brim hats (4-inch brim around the entire circumference of the hat) are also recommended for sun protection.    Due to recent changes in healthcare laws, you may see results of your pathology and/or laboratory studies on MyChart before the doctors have had a chance to review them. We understand that in some cases there may be results that are confusing or concerning to you. Please understand that not all results are received at the same time and often the doctors may need to interpret multiple results in order to provide you with the best plan of care or course of treatment. Therefore, we ask that you please give us  2 business days to thoroughly review all your results before contacting the office for clarification. Should we see a critical lab result, you will be contacted sooner.   If You Need  Anything After Your Visit  If you have any questions or concerns for your doctor, please call our main line at [phone] and press option 4 to reach your doctor's medical assistant. If no one answers, please leave a voicemail as directed and we will return your call as soon as possible. Messages left after 4 pm will be answered the following business day.   You may also send us  a message via MyChart. We typically respond to MyChart messages within 1-2 business days.  For prescription refills, please ask your pharmacy to contact our office. Our fax number is [phone].  If you have an urgent issue when the clinic is closed that cannot wait until the next business day, you can page your doctor at the number below.    Please note that while we do our best to be available for urgent issues outside of office hours, we are not available 24/7.   If you have an urgent issue and are unable to reach us , you may choose to seek medical care at your doctor's office, retail clinic, urgent care center, or emergency room.  If you have a medical emergency, please immediately call 911 or go to the emergency department.  Pager Numbers  - Dr. Hester: [phone]  - Dr. Jackquline: [phone]  - Dr. Claudene: [phone]   In the event of inclement weather, please call our main line at [phone] for an update on the status of any delays or closures.  Dermatology Medication Tips: Please keep the boxes that topical medications come in in order to help keep track of the instructions about where and how to use these. Pharmacies typically print the medication instructions only on the boxes and not directly on the medication tubes.   If your medication is too expensive, please contact our office at [phone] option 4 or send us  a message through MyChart.   We are unable to tell what your co-pay for medications will be in advance as this is different depending on your insurance coverage. However, we  may be able to find a substitute medication at lower cost or fill out paperwork to get insurance to cover a needed medication.   If a prior authorization is required to get your medication covered by your insurance company, please allow us  1-2 business days to complete this process.  Drug prices often vary depending on where the prescription is filled and some pharmacies may offer cheaper prices.  The website www.goodrx.com contains coupons for medications through different pharmacies. The prices here do not account for what the cost may be with help from insurance (  it may be cheaper with your insurance), but the website can give you the price if you did not use any insurance.  - You can print the associated coupon and take it with your prescription to the pharmacy.  - You may also stop by our office during regular business hours and pick up a GoodRx coupon card.  - If you need your prescription sent electronically to a different pharmacy, notify our office through [facility], [place] or by phone at [phone] option 4.     Si Usted Necesita Algo Despus de Su Visita  Tambin puede enviarnos un mensaje a travs de Clinical cytogeneticist. Por lo general respondemos a los mensajes de MyChart en el transcurso de 1 a 2 das hbiles.  Para renovar recetas, por favor pida a su farmacia que se ponga en contacto con nuestra oficina. Randi lakes de fax es [place] [phone].  Si tiene un asunto urgente cuando la clnica est cerrada y que no puede esperar hasta el siguiente da hbil, puede llamar/localizar a su doctor(a) al nmero que aparece a continuacin.   Por favor, tenga en cuenta que aunque hacemos todo lo posible para estar disponibles para asuntos urgentes fuera del horario de [place], no estamos disponibles las 24 horas del da, los 7 [address]  [address] de la [place].   Si tiene un problema urgente y no puede comunicarse con nosotros, puede optar por buscar atencin mdica  en el consultorio de su doctor(a), en una  clnica privada, en un centro de atencin urgente o en una sala de emergencias.  Si tiene Engineer, drilling, por favor llame inmediatamente al 911 o vaya a la sala de emergencias.  Nmeros de bper  - Dr. Hester: [phone]  - Dra. Jackquline: [phone]  - Dr. Claudene: [phone]   En caso de inclemencias del tiempo, por favor llame a landry capes principal al [phone] para una actualizacin sobre el [place] de cualquier retraso o cierre.  Consejos para la medicacin en dermatologa: Por favor, guarde las cajas en las que vienen los medicamentos de uso tpico para ayudarle a seguir las instrucciones sobre dnde y cmo usarlos. Las farmacias generalmente imprimen las instrucciones del medicamento slo en las cajas y no directamente en los tubos del [place].   Si su medicamento es muy caro, por favor, pngase en contacto con landry rieger llamando al [phone] y presione la opcin 4 o envenos un mensaje a travs de Clinical cytogeneticist.   No podemos decirle cul ser su copago por los medicamentos por adelantado ya que esto es diferente dependiendo de la cobertura de su seguro. Sin embargo, es posible que podamos encontrar un medicamento sustituto a Audiological scientist un formulario para que el seguro cubra el medicamento que se considera necesario.   Si se requiere una autorizacin previa para que su compaa de seguros malta su medicamento, por favor permtanos de 1 a 2 das hbiles para completar este proceso.  Los precios de los medicamentos varan con frecuencia dependiendo del Environmental consultant de dnde se surte la receta y alguna farmacias pueden ofrecer precios ms baratos.  El sitio web www.goodrx.com tiene cupones para medicamentos de Health and safety inspector. Los precios aqu no tienen en cuenta lo que podra costar con la ayuda del seguro (puede ser ms barato con su seguro), pero el sitio web puede darle el precio si no utiliz Tourist information centre manager.  - Puede imprimir el cupn correspondiente  y llevarlo con su receta a la farmacia.  - Tambin puede pasar por nuestra oficina durante el horario de  atencin regular y Education officer, museum una tarjeta de cupones de GoodRx.  - Si necesita que su receta se enve electrnicamente a una farmacia diferente, informe a nuestra oficina a travs de MyChart de [place] o por telfono llamando al [phone] y presione la opcin 4.

## 2024-01-22 NOTE — Progress Notes (Signed)
 Subjective   Cheryl Morris is a 83 y.o. female who presents for the following: Total body skin exam for skin cancer screening and mole check. The patient has spots, moles and lesions to be evaluated, some may be new or changing and the patient may have concern these could be cancer. Patient is established patient .   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 Patient reports flakey scalp currently using T-Sal shampoo and fluocinonide  solution for scalp psoriasis, but ran out of solution.   Review of Systems:    No other skin or systemic complaints except as noted in HPI or Assessment and Plan.  The following portions of the chart were reviewed this encounter and updated as appropriate: medications, allergies, medical history  Relevant Medical History:  Personal history of non melanoma skin cancer - see medical history for full details  and Personal history of actinic keratosis   Objective  (SKPE) Well appearing patient in no apparent distress; mood and affect are within normal limits. Examination was performed of the: Full Skin Examination: scalp, head, eyes, ears, nose, lips, neck, chest, axillae, abdomen, back, buttocks, bilateral upper extremities, bilateral lower extremities, hands, feet, fingers, toes, fingernails, and toenails.   Examination notable for: SKIN EXAM, Angioma(s): Scattered red vascular papule(s)  , Lentigo/lentigines: Scattered pigmented macules that are tan to brown in color and are somewhat non-uniform in shape and concentrated in the sun-exposed areas, Nevus/nevi: Scattered well-demarcated, regular, pigmented macule(s) and/or papule(s)  , Seborrheic Keratosis(es): Stuck-on appearing keratotic papule(s) on the trunk, none  irritated with redness, crusting, edema, and/or partial avulsion, Actinic Damage/Elastosis: chronic sun damage: dyspigmentation, telangiectasia, and wrinkling  - Multiple 2-3 mm white papules located on the face. - Erythema and scaling of the  scalp, ear canals, and central face > rest of face. Examination limited by: Undergarments, Shoes or socks , and Patient deferred removal       Assessment & Plan  (SKAP)   SKIN CANCER SCREENING PERFORMED TODAY.  BENIGN SKIN FINDINGS  - Lentigines  - Seborrheic keratoses  - Hemangiomas   - Nevus/Multiple Benign Nevi  - Milia  - Reassurance provided regarding the benign appearance of lesions noted on exam today; no treatment is indicated in the absence of symptoms/changes. - Reinforced importance of photoprotective strategies including liberal and frequent sunscreen use of a broad-spectrum SPF 30 or greater, use of protective clothing, and sun avoidance for prevention of cutaneous malignancy and photoaging.  Counseled patient on the importance of regular self-skin monitoring as well as routine clinical skin examinations as scheduled.   ACTINIC DAMAGE - Chronic condition, secondary to cumulative UV/sun exposure - Recommend daily broad spectrum sunscreen SPF 30+ to sun-exposed areas, reapply every 2 hours as needed.  - Staying in the shade or wearing long sleeves, sun glasses (UVA+UVB protection) and wide brim hats (4-inch brim around the entire circumference of the hat) are also recommended for sun protection.  - Call for new or changing lesions.  Personal history of non melanoma skin cancer  and actinic keratosis  - Reviewed medical history for full details  - Reviewed sun protective measures as above - Encouraged full body skin exams     Seborrheic dermatitis Chronic and persistent condition with duration or expected duration over one year. Condition is symptomatic and bothersome to patient. Patient is flaring and not currently at treatment goal.  - Discussed diagnosis, typical course, and treatment options for this condition - Explained to the patient the chronic nature of this diagnosis -  Start ketoconazole 2% shampoo TIW, apply to scalp and affected areas of face/body and allow the  shampoo to sit for 5-10 minutes before rinsing off - Consider alternating with use of Head and Shoulders or [place] Blue anti-dandruff shampoo which contains zinc pyrithione 1% or T-Sal shampoo. - Start clobetasol solution 0.05% twice daily to affected skin Discussed side effect of super potent topical steroids including atrophy, dyspigmentation, striae, telangectasia, folliculitis, loss of skin pigment, hair growth, tachyphylaxis, risk of systemic absorption with missuse.   Level of service outlined above   Patient instructions (SKPI)   Procedures, orders, diagnosis for this visit:    There are no diagnoses linked to this encounter.  Return to clinic: Return in about 1 year (around 01/21/2025) for TBSE, HxBCC, Seb Derm, w/ Dr. Raymund.  I, Jacquelynn V. Wilfred, CMA, am acting as scribe for Lauraine JAYSON Raymund, MD.  Documentation: I have reviewed the above documentation for accuracy and completeness, and I agree with the above.  Lauraine JAYSON Raymund, MD

## 2024-01-30 DIAGNOSIS — H26491 Other secondary cataract, right eye: Secondary | ICD-10-CM | POA: Diagnosis not present

## 2024-02-11 ENCOUNTER — Ambulatory Visit: Payer: HMO | Admitting: Dermatology

## 2024-02-19 DIAGNOSIS — I10 Essential (primary) hypertension: Secondary | ICD-10-CM | POA: Diagnosis not present

## 2024-02-19 DIAGNOSIS — E782 Mixed hyperlipidemia: Secondary | ICD-10-CM | POA: Diagnosis not present

## 2024-02-19 DIAGNOSIS — R0602 Shortness of breath: Secondary | ICD-10-CM | POA: Diagnosis not present

## 2024-04-01 ENCOUNTER — Ambulatory Visit
Admission: RE | Admit: 2024-04-01 | Discharge: 2024-04-01 | Disposition: A | Discharge status: Home / Self Care | Source: Ambulatory Visit | Attending: Oncology | Admitting: Oncology

## 2024-04-01 DIAGNOSIS — Z9011 Acquired absence of right breast and nipple: Secondary | ICD-10-CM | POA: Insufficient documentation

## 2024-04-01 DIAGNOSIS — Z1231 Encounter for screening mammogram for malignant neoplasm of breast: Secondary | ICD-10-CM | POA: Diagnosis present

## 2024-04-01 DIAGNOSIS — C50511 Malignant neoplasm of lower-outer quadrant of right female breast: Secondary | ICD-10-CM | POA: Insufficient documentation

## 2024-06-30 ENCOUNTER — Inpatient Hospital Stay: Admitting: Oncology

## 2025-01-21 ENCOUNTER — Ambulatory Visit
